# Patient Record
Sex: Male | Born: 1960 | Race: Black or African American | Hispanic: No | Marital: Single | Smoking: Never smoker
Health system: Southern US, Community
[De-identification: ages and names within clinical notes are randomized; demographics above are authoritative.]

## PROBLEM LIST (undated history)

## (undated) ENCOUNTER — Emergency Department (HOSPITAL_COMMUNITY): Disposition: A | Discharge status: Home / Self Care | Payer: Self-pay

## (undated) DIAGNOSIS — Z87442 Personal history of urinary calculi: Secondary | ICD-10-CM

## (undated) DIAGNOSIS — I10 Essential (primary) hypertension: Secondary | ICD-10-CM

## (undated) DIAGNOSIS — K219 Gastro-esophageal reflux disease without esophagitis: Secondary | ICD-10-CM

## (undated) DIAGNOSIS — E7800 Pure hypercholesterolemia, unspecified: Secondary | ICD-10-CM

## (undated) DIAGNOSIS — S63641A Sprain of metacarpophalangeal joint of right thumb, initial encounter: Secondary | ICD-10-CM

## (undated) DIAGNOSIS — M1990 Unspecified osteoarthritis, unspecified site: Secondary | ICD-10-CM

## (undated) DIAGNOSIS — E119 Type 2 diabetes mellitus without complications: Secondary | ICD-10-CM

## (undated) DIAGNOSIS — R7303 Prediabetes: Secondary | ICD-10-CM

## (undated) HISTORY — PX: WISDOM TOOTH EXTRACTION: SHX21

---

## 1999-01-16 ENCOUNTER — Encounter: Payer: Self-pay | Admitting: Emergency Medicine

## 1999-01-16 ENCOUNTER — Emergency Department (HOSPITAL_COMMUNITY): Admission: EM | Admit: 1999-01-16 | Discharge: 1999-01-16 | Payer: Self-pay | Admitting: Emergency Medicine

## 1999-02-17 ENCOUNTER — Emergency Department (HOSPITAL_COMMUNITY): Admission: EM | Admit: 1999-02-17 | Discharge: 1999-02-18 | Payer: Self-pay | Admitting: Emergency Medicine

## 2002-08-03 ENCOUNTER — Emergency Department (HOSPITAL_COMMUNITY): Admission: EM | Admit: 2002-08-03 | Discharge: 2002-08-03 | Payer: Self-pay | Admitting: Emergency Medicine

## 2002-12-06 ENCOUNTER — Emergency Department (HOSPITAL_COMMUNITY): Admission: EM | Admit: 2002-12-06 | Discharge: 2002-12-06 | Payer: Self-pay

## 2002-12-09 ENCOUNTER — Encounter: Payer: Self-pay | Admitting: Emergency Medicine

## 2002-12-09 ENCOUNTER — Emergency Department (HOSPITAL_COMMUNITY): Admission: EM | Admit: 2002-12-09 | Discharge: 2002-12-09 | Payer: Self-pay | Admitting: Emergency Medicine

## 2004-10-11 ENCOUNTER — Emergency Department (HOSPITAL_COMMUNITY): Admission: EM | Admit: 2004-10-11 | Discharge: 2004-10-12 | Payer: Self-pay | Admitting: Emergency Medicine

## 2004-10-16 ENCOUNTER — Emergency Department (HOSPITAL_COMMUNITY): Admission: EM | Admit: 2004-10-16 | Discharge: 2004-10-16 | Payer: Self-pay | Admitting: Family Medicine

## 2005-06-29 ENCOUNTER — Emergency Department (HOSPITAL_COMMUNITY): Admission: EM | Admit: 2005-06-29 | Discharge: 2005-06-30 | Payer: Self-pay | Admitting: Emergency Medicine

## 2005-07-18 ENCOUNTER — Emergency Department (HOSPITAL_COMMUNITY): Admission: EM | Admit: 2005-07-18 | Discharge: 2005-07-18 | Payer: Self-pay | Admitting: Family Medicine

## 2005-09-05 ENCOUNTER — Emergency Department (HOSPITAL_COMMUNITY): Admission: EM | Admit: 2005-09-05 | Discharge: 2005-09-05 | Payer: Self-pay | Admitting: Family Medicine

## 2005-12-08 ENCOUNTER — Emergency Department (HOSPITAL_COMMUNITY): Admission: EM | Admit: 2005-12-08 | Discharge: 2005-12-08 | Payer: Self-pay | Admitting: Family Medicine

## 2006-06-08 ENCOUNTER — Emergency Department (HOSPITAL_COMMUNITY): Admission: EM | Admit: 2006-06-08 | Discharge: 2006-06-08 | Payer: Self-pay | Admitting: Family Medicine

## 2006-06-26 ENCOUNTER — Emergency Department (HOSPITAL_COMMUNITY): Admission: EM | Admit: 2006-06-26 | Discharge: 2006-06-26 | Payer: Self-pay | Admitting: Emergency Medicine

## 2006-11-22 ENCOUNTER — Emergency Department (HOSPITAL_COMMUNITY): Admission: EM | Admit: 2006-11-22 | Discharge: 2006-11-22 | Payer: Self-pay | Admitting: Emergency Medicine

## 2007-02-28 ENCOUNTER — Emergency Department (HOSPITAL_COMMUNITY): Admission: EM | Admit: 2007-02-28 | Discharge: 2007-02-28 | Payer: Self-pay | Admitting: Emergency Medicine

## 2007-03-06 ENCOUNTER — Emergency Department (HOSPITAL_COMMUNITY): Admission: EM | Admit: 2007-03-06 | Discharge: 2007-03-07 | Payer: Self-pay | Admitting: Emergency Medicine

## 2007-12-23 ENCOUNTER — Emergency Department (HOSPITAL_COMMUNITY): Admission: EM | Admit: 2007-12-23 | Discharge: 2007-12-23 | Payer: Self-pay | Admitting: Emergency Medicine

## 2008-01-29 ENCOUNTER — Encounter: Admission: RE | Admit: 2008-01-29 | Discharge: 2008-01-29 | Payer: Self-pay | Admitting: Gastroenterology

## 2008-03-25 ENCOUNTER — Emergency Department (HOSPITAL_COMMUNITY): Admission: EM | Admit: 2008-03-25 | Discharge: 2008-03-25 | Payer: Self-pay | Admitting: Family Medicine

## 2009-06-08 ENCOUNTER — Emergency Department (HOSPITAL_BASED_OUTPATIENT_CLINIC_OR_DEPARTMENT_OTHER): Admission: EM | Admit: 2009-06-08 | Discharge: 2009-06-08 | Payer: Self-pay | Admitting: Emergency Medicine

## 2010-03-12 ENCOUNTER — Emergency Department (HOSPITAL_BASED_OUTPATIENT_CLINIC_OR_DEPARTMENT_OTHER): Admission: EM | Admit: 2010-03-12 | Discharge: 2010-03-12 | Payer: Self-pay | Admitting: Emergency Medicine

## 2010-03-12 ENCOUNTER — Ambulatory Visit: Payer: Self-pay | Admitting: Diagnostic Radiology

## 2010-04-29 ENCOUNTER — Emergency Department (HOSPITAL_COMMUNITY): Admission: EM | Admit: 2010-04-29 | Discharge: 2010-04-29 | Payer: Self-pay | Admitting: Emergency Medicine

## 2010-11-23 LAB — URINALYSIS, ROUTINE W REFLEX MICROSCOPIC
Protein, ur: NEGATIVE mg/dL
Urobilinogen, UA: 0.2 mg/dL (ref 0.0–1.0)

## 2010-11-26 LAB — COMPREHENSIVE METABOLIC PANEL
ALT: 31 U/L (ref 0–53)
AST: 27 U/L (ref 0–37)
Alkaline Phosphatase: 85 U/L (ref 39–117)
CO2: 26 mEq/L (ref 19–32)
Chloride: 104 mEq/L (ref 96–112)
Creatinine, Ser: 1.2 mg/dL (ref 0.4–1.5)
GFR calc Af Amer: >60 mL/min (ref >60)
GFR calc non Af Amer: >60 mL/min (ref >60)
Potassium: 3.9 mEq/L (ref 3.5–5.1)
Sodium: 143 mEq/L (ref 135–145)
Total Bilirubin: 0.6 mg/dL (ref 0.3–1.2)

## 2010-11-26 LAB — COMPREHENSIVE METABOLIC PANEL WITH GFR
Albumin: 4 g/dL (ref 3.5–5.2)
BUN: 14 mg/dL (ref 6–23)
Calcium: 8.6 mg/dL (ref 8.4–10.5)
Glucose, Bld: 146 mg/dL — ABNORMAL HIGH (ref 70–99)
Total Protein: 7.9 g/dL (ref 6.0–8.3)

## 2010-11-26 LAB — CBC
HCT: 40.8 % (ref 39.0–52.0)
Hemoglobin: 13.5 g/dL (ref 13.0–17.0)
MCH: 28.9 pg (ref 26.0–34.0)
MCHC: 33.2 g/dL (ref 30.0–36.0)
MCV: 87.1 fL (ref 78.0–100.0)
Platelets: 275 K/uL (ref 150–400)
RBC: 4.68 MIL/uL (ref 4.22–5.81)
RDW: 12.1 % (ref 11.5–15.5)
WBC: 10.7 K/uL — ABNORMAL HIGH (ref 4.0–10.5)

## 2010-11-26 LAB — URINALYSIS, ROUTINE W REFLEX MICROSCOPIC
Bilirubin Urine: NEGATIVE
Glucose, UA: NEGATIVE mg/dL
Ketones, ur: NEGATIVE mg/dL
Leukocytes, UA: NEGATIVE
Nitrite: NEGATIVE
Protein, ur: NEGATIVE mg/dL
Specific Gravity, Urine: 1.023 (ref 1.005–1.030)
Urobilinogen, UA: 1 mg/dL (ref 0.0–1.0)
pH: 5 (ref 5.0–8.0)

## 2010-11-26 LAB — URINE MICROSCOPIC-ADD ON

## 2010-11-26 LAB — DIFFERENTIAL
Basophils Absolute: 0 10*3/uL (ref 0.0–0.1)
Basophils Relative: 0 % (ref 0–1)
Eosinophils Absolute: 0.2 10*3/uL (ref 0.0–0.7)
Eosinophils Relative: 2 % (ref 0–5)
Lymphocytes Relative: 27 % (ref 12–46)
Lymphs Abs: 2.8 K/uL (ref 0.7–4.0)
Monocytes Absolute: 0.6 K/uL (ref 0.1–1.0)
Monocytes Relative: 6 % (ref 3–12)
Neutro Abs: 7.1 K/uL (ref 1.7–7.7)
Neutrophils Relative %: 65 % (ref 43–77)

## 2010-11-26 LAB — LIPASE, BLOOD: Lipase: 55 U/L (ref 23–300)

## 2011-06-05 LAB — POCT CARDIAC MARKERS: Myoglobin, poc: 228

## 2011-06-05 LAB — COMPREHENSIVE METABOLIC PANEL
ALT: 51
CO2: 27
Calcium: 9.1
Chloride: 98
Creatinine, Ser: 1.16
GFR calc non Af Amer: >60
Glucose, Bld: 97
Sodium: 134 — ABNORMAL LOW
Total Bilirubin: 1.3 — ABNORMAL HIGH

## 2011-06-05 LAB — DIFFERENTIAL
Basophils Absolute: 0
Basophils Relative: 0
Eosinophils Absolute: 0
Eosinophils Relative: 0
Lymphs Abs: 1.6
Neutrophils Relative %: 86 — ABNORMAL HIGH

## 2011-06-05 LAB — CBC
Hemoglobin: 14.8
MCHC: 34.9
MCV: 86
RBC: 4.95
WBC: 14.9 — ABNORMAL HIGH

## 2011-06-05 LAB — CK TOTAL AND CKMB (NOT AT ARMC)
CK, MB: 2.5
Total CK: 683 — ABNORMAL HIGH

## 2011-06-05 LAB — URINALYSIS, ROUTINE W REFLEX MICROSCOPIC
Bilirubin Urine: NEGATIVE
Ketones, ur: NEGATIVE
Nitrite: NEGATIVE
Urobilinogen, UA: 1

## 2011-06-27 LAB — COMPREHENSIVE METABOLIC PANEL
ALT: 24
AST: 17
Albumin: 3.9
Alkaline Phosphatase: 62
BUN: 12
CO2: 30
Calcium: 9.1
Chloride: 105
Creatinine, Ser: 1.24
GFR calc Af Amer: >60
GFR calc non Af Amer: >60
Glucose, Bld: 104 — ABNORMAL HIGH
Potassium: 4.1
Sodium: 140
Total Bilirubin: 0.9
Total Protein: 7.2

## 2011-07-11 ENCOUNTER — Emergency Department (HOSPITAL_COMMUNITY): Payer: Self-pay

## 2011-07-11 ENCOUNTER — Emergency Department (HOSPITAL_COMMUNITY)
Admission: EM | Admit: 2011-07-11 | Discharge: 2011-07-11 | Disposition: A | Discharge status: Home / Self Care | Payer: Self-pay | Attending: Emergency Medicine | Admitting: Emergency Medicine

## 2011-07-11 DIAGNOSIS — R05 Cough: Secondary | ICD-10-CM | POA: Insufficient documentation

## 2011-07-11 DIAGNOSIS — I10 Essential (primary) hypertension: Secondary | ICD-10-CM | POA: Insufficient documentation

## 2011-07-11 DIAGNOSIS — J40 Bronchitis, not specified as acute or chronic: Secondary | ICD-10-CM | POA: Insufficient documentation

## 2011-07-11 DIAGNOSIS — R059 Cough, unspecified: Secondary | ICD-10-CM | POA: Insufficient documentation

## 2011-07-11 DIAGNOSIS — R079 Chest pain, unspecified: Secondary | ICD-10-CM | POA: Insufficient documentation

## 2012-01-26 ENCOUNTER — Emergency Department (HOSPITAL_COMMUNITY)
Admission: EM | Admit: 2012-01-26 | Discharge: 2012-01-27 | Disposition: A | Discharge status: Home / Self Care | Payer: Self-pay | Attending: Emergency Medicine | Admitting: Emergency Medicine

## 2012-01-26 ENCOUNTER — Encounter (HOSPITAL_COMMUNITY): Payer: Self-pay | Admitting: *Deleted

## 2012-01-26 DIAGNOSIS — I2510 Atherosclerotic heart disease of native coronary artery without angina pectoris: Secondary | ICD-10-CM | POA: Insufficient documentation

## 2012-01-26 DIAGNOSIS — M542 Cervicalgia: Secondary | ICD-10-CM | POA: Insufficient documentation

## 2012-01-26 DIAGNOSIS — R079 Chest pain, unspecified: Secondary | ICD-10-CM | POA: Insufficient documentation

## 2012-01-26 DIAGNOSIS — I440 Atrioventricular block, first degree: Secondary | ICD-10-CM | POA: Insufficient documentation

## 2012-01-26 DIAGNOSIS — R51 Headache: Secondary | ICD-10-CM | POA: Insufficient documentation

## 2012-01-26 DIAGNOSIS — R5383 Other fatigue: Secondary | ICD-10-CM | POA: Insufficient documentation

## 2012-01-26 DIAGNOSIS — R5381 Other malaise: Secondary | ICD-10-CM | POA: Insufficient documentation

## 2012-01-26 DIAGNOSIS — E7800 Pure hypercholesterolemia, unspecified: Secondary | ICD-10-CM | POA: Insufficient documentation

## 2012-01-26 DIAGNOSIS — I10 Essential (primary) hypertension: Secondary | ICD-10-CM | POA: Insufficient documentation

## 2012-01-26 HISTORY — DX: Pure hypercholesterolemia, unspecified: E78.00

## 2012-01-26 HISTORY — DX: Essential (primary) hypertension: I10

## 2012-01-26 LAB — CBC
HCT: 40.1 % (ref 39.0–52.0)
Hemoglobin: 13.6 g/dL (ref 13.0–17.0)
MCHC: 33.9 g/dL (ref 30.0–36.0)
RBC: 4.64 MIL/uL (ref 4.22–5.81)
WBC: 11.2 10*3/uL — ABNORMAL HIGH (ref 4.0–10.5)

## 2012-01-26 NOTE — ED Notes (Signed)
Pt c/o sensation of chest fullness and pressure woke with this morning. Pt c/o headache, blurred vision and swollen extremities. Pt denies unilateral weakness, drove self tonight and ambulatory on arrival. Pt in no acute distress at this time.

## 2012-01-26 NOTE — ED Notes (Signed)
Pt states he was awoken this morning with a feeling a chest fullness, neck pain, and "pressure" throughout body. Pt states he usually has similar symptoms when he BP is high. Pt states he is currently out of his BP meds but he did take a "sample pill" today for tx. Pt states the feeling of pressure and pain has subsided but does still c/o slight headache. Pt also states his extremities were swelling but this has also subsided. Pt states he is here "to be checked out" and "make sure it's not something serious".

## 2012-01-27 ENCOUNTER — Encounter (HOSPITAL_COMMUNITY): Payer: Self-pay | Admitting: Emergency Medicine

## 2012-01-27 LAB — TROPONIN I: Troponin I: <0.3 ng/mL (ref <0.30)

## 2012-01-27 LAB — BASIC METABOLIC PANEL
BUN: 12 mg/dL (ref 6–23)
CO2: 29 mEq/L (ref 19–32)
Chloride: 100 mEq/L (ref 96–112)
Glucose, Bld: 86 mg/dL (ref 70–99)
Potassium: 4 mEq/L (ref 3.5–5.1)
Sodium: 138 mEq/L (ref 135–145)

## 2012-01-27 NOTE — Discharge Instructions (Signed)

## 2012-01-27 NOTE — ED Provider Notes (Addendum)
History     CSN: [id number]  Arrival date & time 01/26/12  2301   First MD Initiated Contact with Patient 01/27/12 0008      Chief Complaint  Patient presents with  . Blurred Vision  . Headache  . Chest Pain    pressure    (Consider location/radiation/quality/duration/timing/severity/associated sxs/prior treatment) HPI Comments: Patient presents tonight for concerns that his blood pressure was elevated.  Patient notes that his primary care physician has been adjusting his blood pressure medications at home to obtain better control.  Will patient notes that she intermittently gets symptoms at night like tonight.  He feels a throbbing in his neck and his posterior head and believes is related to his blood pressure.  He describes a feeling of swelling in his abdomen and in his hands.  No leg swelling.  No specific chest pain or shortness of breath but feels that his potential abdominal swelling is causing pressure.  Patient also describes some vague generalized weakness particularly with chewing.  He states that after a few bites he has to stop to work to swallow more often and believes that this is just started since the new blood pressure medication.  Patient is a 51 y.o. male presenting with headaches and chest pain. The history is provided by the patient. No language interpreter was used.  Headache  This is a recurrent problem. The current episode started 1 to 2 hours ago. Pertinent negatives include no fever, no shortness of breath, no nausea and no vomiting.  Chest Pain Pertinent negatives for primary symptoms include no fever, no shortness of breath, no cough, no abdominal pain, no nausea and no vomiting.  Associated symptoms include weakness.     Past Medical History  Diagnosis Date  . Hypertension   . Hypercholesterolemia   . Coronary artery disease     History reviewed. No pertinent past surgical history.  History reviewed. No pertinent family history.  History    Substance Use Topics  . Smoking status: Never Smoker   . Smokeless tobacco: Not on file  . Alcohol Use: No      Review of Systems  Constitutional: Negative for fever and chills.  Eyes: Negative.  Negative for discharge and redness.  Respiratory: Negative.  Negative for cough and shortness of breath.   Cardiovascular: Negative for chest pain.  Gastrointestinal: Negative.  Negative for nausea, vomiting and abdominal pain.  Genitourinary: Negative.  Negative for hematuria.  Musculoskeletal: Negative for back pain.  Skin: Negative.  Negative for color change and rash.  Neurological: Positive for weakness and headaches. Negative for syncope.  Hematological: Negative.  Negative for adenopathy.  Psychiatric/Behavioral: Negative.  Negative for confusion.  All other systems reviewed and are negative.    Allergies  Review of patient's allergies indicates no known allergies.  Home Medications   Current Outpatient Rx  Name Route Sig Dispense Refill  . AMLODIPINE-VALSARTAN-HCTZ 5-160-12.5 MG PO TABS Oral Take 1 tablet by mouth daily. Sample Medication given from Dr. Chestine Spore      BP 142/87  Pulse 52  Temp 97.9 F (36.6 C)  Wt 220 lb 6 oz (99.961 kg)  SpO2 99%  Physical Exam  Nursing note and vitals reviewed. Constitutional: He is oriented to person, place, and time. He appears well-developed and well-nourished.  Non-toxic appearance. He does not have a sickly appearance.  HENT:  Head: Normocephalic and atraumatic.  Eyes: Conjunctivae, EOM and lids are normal. Pupils are equal, round, and reactive to light.  Neck: Trachea normal,  normal range of motion and full passive range of motion without pain. Neck supple.  Cardiovascular: Normal rate, regular rhythm and normal heart sounds.   Pulmonary/Chest: Effort normal and breath sounds normal. No respiratory distress.  Abdominal: Soft. Normal appearance. He exhibits no distension. There is no tenderness. There is no rebound and no CVA  tenderness.  Musculoskeletal: Normal range of motion.  Neurological: He is alert and oriented to person, place, and time. He has normal strength.  Skin: Skin is warm, dry and intact. No rash noted.  Psychiatric: He has a normal mood and affect. His behavior is normal. Judgment and thought content normal.    ED Course  Procedures (including critical care time)  Results for orders placed during the hospital encounter of 01/26/12  CBC      Component Value Range   WBC 11.2 (*) 4.0 - 10.5 (K/uL)   RBC 4.64  4.22 - 5.81 (MIL/uL)   Hemoglobin 13.6  13.0 - 17.0 (g/dL)   HCT 40.9  81.1 - 91.4 (%)   MCV 86.4  78.0 - 100.0 (fL)   MCH 29.3  26.0 - 34.0 (pg)   MCHC 33.9  30.0 - 36.0 (g/dL)   RDW 78.2  95.6 - 21.3 (%)   Platelets 294  150 - 400 (K/uL)  BASIC METABOLIC PANEL      Component Value Range   Sodium 138  135 - 145 (mEq/L)   Potassium 4.0  3.5 - 5.1 (mEq/L)   Chloride 100  96 - 112 (mEq/L)   CO2 29  19 - 32 (mEq/L)   Glucose, Bld 86  70 - 99 (mg/dL)   BUN 12  6 - 23 (mg/dL)   Creatinine, Ser 0.86  0.50 - 1.35 (mg/dL)   Calcium 9.1  8.4 - 57.8 (mg/dL)   GFR calc non Af Amer 82 (*) >90 (mL/min)   GFR calc Af Amer >90  >90 (mL/min)  PRO B NATRIURETIC PEPTIDE      Component Value Range   Pro B Natriuretic peptide (BNP) 103.5  0 - 125 (pg/mL)  TROPONIN I      Component Value Range   Troponin I <0.30  <0.30 (ng/mL)    Date: 01/27/2012  Rate: 53  Rhythm: normal sinus rhythm  QRS Axis: normal  Intervals: Prolonged PR  ST/T Wave abnormalities: normal  Conduction Disutrbances:first-degree A-V block   Narrative Interpretation:   Old EKG Reviewed: unchanged from 07/11/11     MDM  Patient with concerns primarily about his blood pressure which does not show significant elevation here.  He notes some mild neck throbbing which he refuses even Tylenol or ibuprofen for her.  Patient does not specifically describe chest pain or pressure to me at this time nor any specific shortness of  breath to indicate that he is signs of ACS.  I am going to check electrolytes to evaluate for the patient's feeling of generalized weakness which potentially be related to his diuretic.  I've also advised him that he needs to followup with Dr. Chestine Spore in the next one to 2 weeks as he may potentially be having adverse affects from his blood pressure medication and may need adjustments on this.        Nat Christen, MD 01/27/12 [id number]  Nat Christen, MD 01/27/12 [phone]

## 2012-11-05 ENCOUNTER — Emergency Department (HOSPITAL_COMMUNITY)
Admission: EM | Admit: 2012-11-05 | Discharge: 2012-11-05 | Disposition: A | Discharge status: Home / Self Care | Payer: Self-pay | Attending: Emergency Medicine | Admitting: Emergency Medicine

## 2012-11-05 ENCOUNTER — Emergency Department (HOSPITAL_COMMUNITY): Payer: Self-pay

## 2012-11-05 ENCOUNTER — Encounter (HOSPITAL_COMMUNITY): Payer: Self-pay | Admitting: *Deleted

## 2012-11-05 DIAGNOSIS — Z862 Personal history of diseases of the blood and blood-forming organs and certain disorders involving the immune mechanism: Secondary | ICD-10-CM | POA: Insufficient documentation

## 2012-11-05 DIAGNOSIS — X58XXXA Exposure to other specified factors, initial encounter: Secondary | ICD-10-CM | POA: Insufficient documentation

## 2012-11-05 DIAGNOSIS — I10 Essential (primary) hypertension: Secondary | ICD-10-CM | POA: Insufficient documentation

## 2012-11-05 DIAGNOSIS — I2510 Atherosclerotic heart disease of native coronary artery without angina pectoris: Secondary | ICD-10-CM | POA: Insufficient documentation

## 2012-11-05 DIAGNOSIS — Y939 Activity, unspecified: Secondary | ICD-10-CM | POA: Insufficient documentation

## 2012-11-05 DIAGNOSIS — Z8639 Personal history of other endocrine, nutritional and metabolic disease: Secondary | ICD-10-CM | POA: Insufficient documentation

## 2012-11-05 DIAGNOSIS — S93409A Sprain of unspecified ligament of unspecified ankle, initial encounter: Secondary | ICD-10-CM | POA: Insufficient documentation

## 2012-11-05 DIAGNOSIS — Y929 Unspecified place or not applicable: Secondary | ICD-10-CM | POA: Insufficient documentation

## 2012-11-05 DIAGNOSIS — S93402A Sprain of unspecified ligament of left ankle, initial encounter: Secondary | ICD-10-CM

## 2012-11-05 MED ORDER — IBUPROFEN 600 MG PO TABS: 600.0000 mg | ORAL_TABLET | Freq: Four times a day (QID) | ORAL | Status: DC | PRN

## 2012-11-05 NOTE — ED Provider Notes (Signed)
History     CSN: [id number]  Arrival date & time 11/05/12  0105   First MD Initiated Contact with Patient 11/05/12 0120      Chief Complaint  Patient presents with  . Ankle Injury    (Consider location/radiation/quality/duration/timing/severity/associated sxs/prior treatment) HPI History per PT. Twisted left ankle with inversion injury today prior to arrival.  Has pain and swelling over the lateral malleolus. Hurts to bear weight. Mild/moderate severity. No weakness or numbness. No other pain trauma or injury. Declines any pain medications at this time. No fall. No neck pain. No LOC. Past Medical History  Diagnosis Date  . Hypertension   . Hypercholesterolemia   . Coronary artery disease     History reviewed. No pertinent past surgical history.  History reviewed. No pertinent family history.  History  Substance Use Topics  . Smoking status: Never Smoker   . Smokeless tobacco: Not on file  . Alcohol Use: No      Review of Systems  Constitutional: Negative for fever and diaphoresis.  HENT: Negative for neck pain.   Eyes: Negative for visual disturbance.  Respiratory: Negative for shortness of breath.   Cardiovascular: Negative for chest pain.  Gastrointestinal: Negative for abdominal pain.  Genitourinary: Negative for flank pain.  Musculoskeletal: Negative for back pain.  Skin: Negative for rash.  Neurological: Negative for syncope and headaches.  All other systems reviewed and are negative.    Allergies  Review of patient's allergies indicates no known allergies.  Home Medications  No current outpatient prescriptions on file.  BP 163/87  Pulse 87  Temp(Src) 98.7 F (37.1 C) (Oral)  Resp 18  SpO2 100%  Physical Exam  Constitutional: He is oriented to person, place, and time. He appears well-developed and well-nourished.  HENT:  Head: Normocephalic and atraumatic.  Eyes: EOM are normal. Pupils are equal, round, and reactive to light.  Neck: Neck  supple.  Cardiovascular: Regular rhythm and intact distal pulses.   Pulmonary/Chest: Effort normal. No respiratory distress.  Musculoskeletal:  Left lower extremity with swelling and tenderness over the lateral malleolus. Skin intact throughout. No ecchymosis. Distal neurovascular intact. No tenderness over the proximal fibula or foot.   Neurological: He is alert and oriented to person, place, and time.  Skin: Skin is warm and dry.    ED Course  Procedures (including critical care time)  Labs Reviewed - No data to display Dg Ankle Complete Left  11/05/2012  *RADIOLOGY REPORT*  Clinical Data: Left ankle injury and pain.  LEFT ANKLE COMPLETE - 3+ VIEW  Comparison: 03/25/2008  Findings: There is no evidence of acute fracture, subluxation or dislocation. The ankle mortise is intact. The talar dome is unremarkable. No focal bony lesions are present. Lateral soft tissue swelling is noted.  IMPRESSION: Soft tissue swelling without acute bony abnormality.   Original Report Authenticated By: Harmon Pier, M.D.    Ice. Splint. Crutches.   Ankle sprain precautions provided and verbalizes understood.    Occult fracture precautions provided.   MDM  Left ankle sprain  Distal neurovascular intact after application of splint  Vital signs and nursing notes reviewed and considered.        Sunnie Nielsen, MD 11/05/12 Earle Gell

## 2012-12-01 ENCOUNTER — Encounter (HOSPITAL_BASED_OUTPATIENT_CLINIC_OR_DEPARTMENT_OTHER): Payer: Self-pay

## 2012-12-01 ENCOUNTER — Emergency Department (HOSPITAL_BASED_OUTPATIENT_CLINIC_OR_DEPARTMENT_OTHER)
Admission: EM | Admit: 2012-12-01 | Discharge: 2012-12-01 | Disposition: A | Discharge status: Home / Self Care | Payer: Self-pay | Attending: Emergency Medicine | Admitting: Emergency Medicine

## 2012-12-01 DIAGNOSIS — R197 Diarrhea, unspecified: Secondary | ICD-10-CM | POA: Insufficient documentation

## 2012-12-01 DIAGNOSIS — I10 Essential (primary) hypertension: Secondary | ICD-10-CM | POA: Insufficient documentation

## 2012-12-01 DIAGNOSIS — R109 Unspecified abdominal pain: Secondary | ICD-10-CM | POA: Insufficient documentation

## 2012-12-01 DIAGNOSIS — I2510 Atherosclerotic heart disease of native coronary artery without angina pectoris: Secondary | ICD-10-CM | POA: Insufficient documentation

## 2012-12-01 DIAGNOSIS — R42 Dizziness and giddiness: Secondary | ICD-10-CM | POA: Insufficient documentation

## 2012-12-01 DIAGNOSIS — IMO0001 Reserved for inherently not codable concepts without codable children: Secondary | ICD-10-CM | POA: Insufficient documentation

## 2012-12-01 DIAGNOSIS — E7800 Pure hypercholesterolemia, unspecified: Secondary | ICD-10-CM | POA: Insufficient documentation

## 2012-12-01 DIAGNOSIS — R112 Nausea with vomiting, unspecified: Secondary | ICD-10-CM | POA: Insufficient documentation

## 2012-12-01 LAB — URINALYSIS, ROUTINE W REFLEX MICROSCOPIC
Glucose, UA: NEGATIVE mg/dL
Hgb urine dipstick: NEGATIVE
Ketones, ur: NEGATIVE mg/dL
Leukocytes, UA: NEGATIVE
Protein, ur: NEGATIVE mg/dL
Urobilinogen, UA: 1 mg/dL (ref 0.0–1.0)

## 2012-12-01 LAB — COMPREHENSIVE METABOLIC PANEL
Albumin: 3.9 g/dL (ref 3.5–5.2)
Alkaline Phosphatase: 75 U/L (ref 39–117)
BUN: 25 mg/dL — ABNORMAL HIGH (ref 6–23)
CO2: 25 mEq/L (ref 19–32)
Chloride: 98 mEq/L (ref 96–112)
Creatinine, Ser: 1.3 mg/dL (ref 0.50–1.35)
GFR calc non Af Amer: 63 mL/min — ABNORMAL LOW (ref >90)
Potassium: 3.8 mEq/L (ref 3.5–5.1)
Total Bilirubin: 0.4 mg/dL (ref 0.3–1.2)

## 2012-12-01 LAB — CBC
HCT: 43.4 % (ref 39.0–52.0)
Hemoglobin: 14.8 g/dL (ref 13.0–17.0)
MCV: 86.5 fL (ref 78.0–100.0)
RBC: 5.02 MIL/uL (ref 4.22–5.81)
RDW: 13 % (ref 11.5–15.5)
WBC: 7.1 10*3/uL (ref 4.0–10.5)

## 2012-12-01 MED ORDER — SODIUM CHLORIDE 0.9 % IV BOLUS (SEPSIS)
1000.0000 mL | Freq: Once | INTRAVENOUS | Status: AC
  Administered 2012-12-01: 1000 mL via INTRAVENOUS

## 2012-12-01 NOTE — ED Notes (Signed)
Pt states that he feels like he has a stomach flu, abdominal cramping, feverish, chills, body aches, nausea, vomited x2 in past 12 hours.

## 2012-12-01 NOTE — ED Notes (Signed)
Pt given 240cc ginger ale for fluid challenge.

## 2012-12-01 NOTE — ED Provider Notes (Signed)
History  This chart was scribed for Ethelda Chick, MD by Shari Heritage, ED Scribe. The patient was seen in room MH04/MH04. Patient's care was started at 1509.   CSN: [id number]  Arrival date & time 12/01/12  1341   First MD Initiated Contact with Patient 12/01/12 [phone]      Chief Complaint  Patient presents with  . Diarrhea     Patient is a 52 y.o. male presenting with diarrhea. The history is provided by the patient. No language interpreter was used.  Diarrhea Quality:  Watery Severity:  Moderate Onset quality:  Gradual Duration:  4 days Timing:  Intermittent Progression:  Improving Relieved by:  Anti-motility medications Associated symptoms: abdominal pain, myalgias and vomiting   Associated symptoms: no fever      HPI Comments: Donald Cole is a 52 y.o. male who presents to the Emergency Department complaining of gradually improving intermittent diarrhea, sporadic generalized abdominal cramping and lightheadedness. Patient states that watery diarrhea began 4 days ago. He says that he took Immodium last night that made his stool firmer for one bowel movement, but then diarrhea persisted. He reports one episode of loose stool today. He says that he has also had persistent feelings of lightheadedness today which prompted him to come to the ED for treatment. There is associated chills, body aches, nausea and vomiting. Patient states that he has had 2 episodes of vomiting in the past 12 hours. Patient denies blood in stool, hematemesis, fever, shortness of breath or chest pain. He has a medical history of hypertension, hypercholesterolemia and coronary artery disease. Patient does not smoke.  Past Medical History  Diagnosis Date  . Hypertension   . Hypercholesterolemia   . Coronary artery disease     History reviewed. No pertinent past surgical history.  History reviewed. No pertinent family history.  History  Substance Use Topics  . Smoking status: Never Smoker   .  Smokeless tobacco: Never Used  . Alcohol Use: No      Review of Systems  Constitutional: Negative for fever.  Respiratory: Negative for shortness of breath.   Cardiovascular: Negative for chest pain.  Gastrointestinal: Positive for nausea, vomiting, abdominal pain and diarrhea. Negative for blood in stool.  Musculoskeletal: Positive for myalgias.  Neurological: Positive for light-headedness.  All other systems reviewed and are negative.    Allergies  Review of patient's allergies indicates no known allergies.  Home Medications   Current Outpatient Rx  Name  Route  Sig  Dispense  Refill  . loperamide (IMODIUM A-D) 2 MG tablet   Oral   Take 2 mg by mouth 4 (four) times daily as needed for diarrhea or loose stools.         Marland Kitchen ibuprofen (ADVIL,MOTRIN) 600 MG tablet   Oral   Take 1 tablet (600 mg total) by mouth every 6 (six) hours as needed for pain.   15 tablet   0     Triage Vitals: BP 105/61  Pulse 63  Temp(Src) 98.1 F (36.7 C) (Oral)  Resp 16  Ht 5\' 8"  (1.727 m)  Wt 215 lb (97.523 kg)  BMI 32.7 kg/m2  SpO2 99%  Physical Exam  Constitutional: He is oriented to person, place, and time. He appears well-developed and well-nourished.  HENT:  Mouth/Throat: Oropharynx is clear and moist and mucous membranes are normal. Mucous membranes are not dry.  Eyes: Conjunctivae are normal. Pupils are equal, round, and reactive to light. No scleral icterus.  Neck: Normal range of motion.  Neck supple.  Cardiovascular: Normal rate, regular rhythm and normal heart sounds.   No murmur heard. Pulmonary/Chest: Effort normal and breath sounds normal. No respiratory distress.  Abdominal: Soft. Bowel sounds are normal. He exhibits no distension and no mass. There is no tenderness. There is no rebound and no guarding.  Musculoskeletal: Normal range of motion.  Neurological: He is alert and oriented to person, place, and time.  Skin: Skin is warm and dry.  Cap refill less than 3  seconds bilaterally.  Psychiatric: He has a normal mood and affect. His behavior is normal.    ED Course  Procedures (including critical care time) DIAGNOSTIC STUDIES: Oxygen Saturation is 99% on room air, normal by my interpretation.    COORDINATION OF CARE: 3:19 PM- Patient informed of current plan for treatment and evaluation and agrees with plan at this time.    Date: 12/01/2012  Rate: 53  Rhythm: sinus bradycardia with first degree AV block  QRS Axis: normal  Intervals: PR prolonged  ST/T Wave abnormalities: nonspecific T wave changes  Conduction Disutrbances:first-degree A-V block   Narrative Interpretation:   Old EKG Reviewed: no significant changes compared to prior ekg of 01/26/12      Labs Reviewed  COMPREHENSIVE METABOLIC PANEL - Abnormal; Notable for the following:    BUN 25 (*)    GFR calc non Af Amer 63 (*)    GFR calc Af Amer 73 (*)    All other components within normal limits  CBC  URINALYSIS, ROUTINE W REFLEX MICROSCOPIC   No results found.   1. Diarrhea       MDM  Pt presenting with c/o diarrhea over the past 4 days, also feels lightheaded intermittently.  Labs reassuring, given IV hydration and BP improved.  EKG without acute abnormalities or changes.  Suspect viral process.  Discharged with strict return precautions.  Pt agreeable with plan.   I personally performed the services described in this documentation, which was scribed in my presence. The recorded information has been reviewed and is accurate.    Ethelda Chick, MD 12/01/12 1750

## 2013-04-04 ENCOUNTER — Encounter (HOSPITAL_COMMUNITY): Payer: Self-pay | Admitting: *Deleted

## 2013-04-04 ENCOUNTER — Emergency Department (HOSPITAL_COMMUNITY)
Admission: EM | Admit: 2013-04-04 | Discharge: 2013-04-04 | Disposition: A | Discharge status: Home / Self Care | Payer: BC Managed Care – PPO | Attending: Emergency Medicine | Admitting: Emergency Medicine

## 2013-04-04 DIAGNOSIS — I10 Essential (primary) hypertension: Secondary | ICD-10-CM | POA: Insufficient documentation

## 2013-04-04 DIAGNOSIS — E785 Hyperlipidemia, unspecified: Secondary | ICD-10-CM | POA: Insufficient documentation

## 2013-04-04 DIAGNOSIS — R112 Nausea with vomiting, unspecified: Secondary | ICD-10-CM | POA: Insufficient documentation

## 2013-04-04 DIAGNOSIS — I2510 Atherosclerotic heart disease of native coronary artery without angina pectoris: Secondary | ICD-10-CM | POA: Insufficient documentation

## 2013-04-04 DIAGNOSIS — R197 Diarrhea, unspecified: Secondary | ICD-10-CM | POA: Insufficient documentation

## 2013-04-04 DIAGNOSIS — R55 Syncope and collapse: Secondary | ICD-10-CM | POA: Insufficient documentation

## 2013-04-04 DIAGNOSIS — R404 Transient alteration of awareness: Secondary | ICD-10-CM | POA: Insufficient documentation

## 2013-04-04 LAB — POCT I-STAT, CHEM 8
Creatinine, Ser: 1.9 mg/dL — ABNORMAL HIGH (ref 0.50–1.35)
Hemoglobin: 15.3 g/dL (ref 13.0–17.0)
Sodium: 139 mEq/L (ref 135–145)
TCO2: 24 mmol/L (ref 0–100)

## 2013-04-04 LAB — URINALYSIS, ROUTINE W REFLEX MICROSCOPIC
Glucose, UA: NEGATIVE mg/dL
Leukocytes, UA: NEGATIVE
Protein, ur: 100 mg/dL — AB
pH: 5.5 (ref 5.0–8.0)

## 2013-04-04 LAB — CBC WITH DIFFERENTIAL/PLATELET
Eosinophils Absolute: 0.1 10*3/uL (ref 0.0–0.7)
Lymphocytes Relative: 13 % (ref 12–46)
Lymphs Abs: 1.9 10*3/uL (ref 0.7–4.0)
Neutro Abs: 11.5 10*3/uL — ABNORMAL HIGH (ref 1.7–7.7)
Neutrophils Relative %: 80 % — ABNORMAL HIGH (ref 43–77)
Platelets: 257 10*3/uL (ref 150–400)
RBC: 4.84 MIL/uL (ref 4.22–5.81)
WBC: 14.4 10*3/uL — ABNORMAL HIGH (ref 4.0–10.5)

## 2013-04-04 LAB — POCT I-STAT TROPONIN I

## 2013-04-04 LAB — URINE MICROSCOPIC-ADD ON

## 2013-04-04 LAB — GLUCOSE, CAPILLARY: Glucose-Capillary: 148 mg/dL — ABNORMAL HIGH (ref 70–99)

## 2013-04-04 MED ORDER — SODIUM CHLORIDE 0.9 % IV SOLN
1000.0000 mL | Freq: Once | INTRAVENOUS | Status: AC
  Administered 2013-04-04: 1000 mL via INTRAVENOUS

## 2013-04-04 MED ORDER — ONDANSETRON 4 MG PO TBDP
8.0000 mg | ORAL_TABLET | Freq: Once | ORAL | Status: AC
  Administered 2013-04-04: 8 mg via ORAL
  Filled 2013-04-04: qty 2

## 2013-04-04 MED ORDER — ONDANSETRON 4 MG PO TBDP: 4.0000 mg | ORAL_TABLET | Freq: Four times a day (QID) | ORAL | Status: DC | PRN

## 2013-04-04 MED ORDER — LOPERAMIDE HCL 2 MG PO CAPS
4.0000 mg | ORAL_CAPSULE | Freq: Once | ORAL | Status: AC
  Administered 2013-04-04: 4 mg via ORAL
  Filled 2013-04-04: qty 2

## 2013-04-04 MED ORDER — SODIUM CHLORIDE 0.9 % IV SOLN: 1000.0000 mL | INTRAVENOUS | Status: DC

## 2013-04-04 NOTE — ED Notes (Signed)
Patient ambulated up and down hallway with minimal assistance.  No dizziness or lightheadedness noted.

## 2013-04-04 NOTE — ED Provider Notes (Signed)
CSN: [id number]     Arrival date & time 04/04/13  0008 History     First MD Initiated Contact with Patient 04/04/13 0036     Chief Complaint  Patient presents with  . Emesis  . Loss of Consciousness  . Diarrhea   HPI Donald Cole is a 52 y.o. male who presents with nausea vomiting and diarrhea, and a syncopal episode while vomiting. Patient says he eats hot dogs about 1:00 in the morning, he woke up this morning with nausea and vomiting. He's also had associated diarrhea throughout the day, as are frequent watery stools without any blood. He has taken some loperamide with some relief of diarrhea. He has noted some mild incontinence of stool due to the watery nature. He says at one point he was vomiting, ringing his hands against the wall, vomited forcefully and then had a syncopal episode. He denies any pain, denies any headache. He denies any chest pains, shortness of breath, he has some crampy diffuse abdominal pains, that are intermittent. Denies any dysuria, frequency.  Past Medical History  Diagnosis Date  . Hypertension   . Hypercholesterolemia   . Coronary artery disease    History reviewed. No pertinent past surgical history. No family history on file. History  Substance Use Topics  . Smoking status: Never Smoker   . Smokeless tobacco: Never Used  . Alcohol Use: No    Review of Systems At least 10pt or greater review of systems completed and are negative except where specified in the HPI.  Allergies  Review of patient's allergies indicates no known allergies.  Home Medications   Current Outpatient Rx  Name  Route  Sig  Dispense  Refill  . ibuprofen (ADVIL,MOTRIN) 600 MG tablet   Oral   Take 1 tablet (600 mg total) by mouth every 6 (six) hours as needed for pain.   15 tablet   0   . loperamide (IMODIUM A-D) 2 MG tablet   Oral   Take 2 mg by mouth 4 (four) times daily as needed for diarrhea or loose stools.          BP 70/52  Temp(Src) 97.4 F (36.3 C)  (Axillary)  Resp 18  SpO2 99% Physical Exam  Nursing notes reviewed.  Electronic medical record reviewed. VITAL SIGNS:   Filed Vitals:   04/04/13 0040  BP: 70/52  Temp: 97.4 F (36.3 C)  TempSrc: Axillary  Resp: 18  SpO2: 99%   CONSTITUTIONAL: Awake, oriented, appears non-toxic HENT: Atraumatic, normocephalic, oral mucosa pink and moist, airway patent. Nares patent without drainage. External ears normal. EYES: Conjunctiva clear, EOMI, PERRLA NECK: Trachea midline, non-tender, supple CARDIOVASCULAR: Normal heart rate, Normal rhythm, No murmurs, rubs, gallops PULMONARY/CHEST: Clear to auscultation, no rhonchi, wheezes, or rales. Symmetrical breath sounds. Non-tender. ABDOMINAL: Non-distended, soft, non-tender - no rebound or guarding.  BS normal. NEUROLOGIC: Non-focal, moving all four extremities, no gross sensory or motor deficits. EXTREMITIES: No clubbing, cyanosis, or edema SKIN: Warm, Dry, No erythema, No rash  ED Course   Procedures (including critical care time)  Date: 04/06/2013  Rate: 70  Rhythm: sinus rhythm  QRS Axis: normal  Intervals: Prolonged PR  ST/T Wave abnormalities: normal  Conduction Disutrbances: none  Narrative Interpretation: Slightly prolonged PR, no significant morphological changes when compared with prior EKG, nonischemic  Labs Reviewed  CBC WITH DIFFERENTIAL - Abnormal; Notable for the following:    WBC 14.4 (*)    Neutrophils Relative % 80 (*)    Neutro Abs  11.5 (*)    All other components within normal limits  URINALYSIS, ROUTINE W REFLEX MICROSCOPIC - Abnormal; Notable for the following:    APPearance TURBID (*)    Bilirubin Urine SMALL (*)    Protein, ur 100 (*)    All other components within normal limits  GLUCOSE, CAPILLARY - Abnormal; Notable for the following:    Glucose-Capillary 148 (*)    All other components within normal limits  URINE MICROSCOPIC-ADD ON - Abnormal; Notable for the following:    Bacteria, UA MANY (*)     Casts GRANULAR CAST (*)    Crystals CA OXALATE CRYSTALS (*)    All other components within normal limits  POCT I-STAT, CHEM 8 - Abnormal; Notable for the following:    Creatinine, Ser 1.90 (*)    Glucose, Bld 151 (*)    All other components within normal limits  POCT I-STAT TROPONIN I   No results found. 1. Nausea vomiting and diarrhea   2. Vaso vagal episode    Medications  ondansetron (ZOFRAN-ODT) disintegrating tablet 8 mg (8 mg Oral Given 04/04/13 0032)  loperamide (IMODIUM) capsule 4 mg (4 mg Oral Given 04/04/13 0131)  0.9 %  sodium chloride infusion (0 mLs Intravenous Stopped 04/04/13 0253)    Followed by  0.9 %  sodium chloride infusion (0 mLs Intravenous Stopped 04/04/13 0438)    MDM  Patient presents with nausea vomiting and diarrhea, he attributes these symptoms to eating some hot dogs. Patient had a syncopal episode while vomiting - patient's neurologic exam is unremarkable, he is able to get up and walk, is received some fluids and feels much better after medication including Zofran, loperamide and fluid. Patient has not had any chest pain any associated shortness of breath, did have some mild postural dizziness, that is gone with fluid.  The patient was mildly dehydrated from his bout of nausea vomiting and diarrhea.  I do not think his syncope is related to a heart arrhythmia, MI - clinical history is consistent with a vagal episode with vomiting.  I do not think this is an atypical presentation of ACS, EKG was checked, troponin is negative, EKG is unremarkable and unchanged from prior EKG. Other labs are unremarkable with the exception of a slight increase of his creatinine, this is likely secondary to dehydration. We'll have the patient followup with his primary care physician, for creatinine recheck in followup.  Patient discharged home stable and in good condition, patient understands and accepts the medical plan as it's been dictated and agrees with it. I have answered all his  questions to his satisfaction. He'll go home with some Zofran, and can continue to take loperamide as long as he does not have any high fevers, blood in the stools, he understands these instructions.  Jones Skene, MD 04/06/13 1610

## 2013-04-04 NOTE — ED Notes (Addendum)
Pt reports eating hotdogs last night around 0100. Woke this am with nv. Reports syncope while vomiting. Mentions loss control of bowels. nv continued through out the day. BP noted to be low in triage. Alert, NAD, calm, generally weak. Pt alert, NAD, calm, interactive, skin cool and dry. Last emesis PTA.

## 2013-08-04 ENCOUNTER — Encounter (HOSPITAL_COMMUNITY): Payer: Self-pay | Admitting: Emergency Medicine

## 2013-08-04 ENCOUNTER — Emergency Department (INDEPENDENT_AMBULATORY_CARE_PROVIDER_SITE_OTHER)
Admission: EM | Admit: 2013-08-04 | Discharge: 2013-08-04 | Disposition: A | Discharge status: Home / Self Care | Payer: Self-pay | Source: Home / Self Care | Attending: Family Medicine | Admitting: Family Medicine

## 2013-08-04 DIAGNOSIS — I10 Essential (primary) hypertension: Secondary | ICD-10-CM

## 2013-08-04 NOTE — ED Notes (Signed)
C/o chest pain with a strong beat States he has SOB

## 2013-08-04 NOTE — ED Provider Notes (Signed)
Donald Cole is a 52 y.o. male who presents to Urgent Care today for chest discomfort. Over the past several days patient has been aware of his heart beat. He denies any significant chest pain palpitations chest tightness squeezing or significant shortness of breath. He denies any radiating pain or exertional symptoms. He thinks perhaps his blood pressure is a bit high. He notes that he is pretty inconsistent with his blood pressure medication. He cannot recall his blood pressure medication is and is not sure which pharmacy he gets it filled at. He denies any lightheadedness dizziness or syncope.   Past Medical History  Diagnosis Date  . Hypertension   . Hypercholesterolemia   . Coronary artery disease    History  Substance Use Topics  . Smoking status: Never Smoker   . Smokeless tobacco: Never Used  . Alcohol Use: No   ROS as above Medications reviewed. No current facility-administered medications for this encounter.   Current Outpatient Prescriptions  Medication Sig Dispense Refill  . amLODipine-benazepril (LOTREL) 5-20 MG per capsule Take 1 capsule by mouth daily.      Marland Kitchen aspirin EC 81 MG tablet Take 81 mg by mouth daily.      . ondansetron (ZOFRAN ODT) 4 MG disintegrating tablet Take 1 tablet (4 mg total) by mouth every 6 (six) hours as needed for nausea.  10 tablet  0  . pravastatin (PRAVACHOL) 20 MG tablet Take 20 mg by mouth daily.        Exam:  BP 167/98  Pulse 76  Temp(Src) 98.2 F (36.8 C) (Oral)  Resp 18  SpO2 98%  Gen: Well NAD HEENT: EOMI,  MMM Lungs: Normal work of breathing. CTABL Heart: RRR no MRG Abd: NABS, Soft. NT, ND Exts: Non edematous BL  LE, warm and well perfused.   Twelve-lead EKG shows normal sinus rhythm at 69 beats per minute. Not significantly changed from prior EKG. Poor R wave progression. No results found for this or any previous visit (from the past 24 hour(s)). No results found.  Assessment and Plan: 52 y.o. male with elevated blood  pressure. Patient is aware of his heart beat.  This may be due to elevated blood pressure. He does not have any anginal symptoms currently. I believe the patient is not taking his blood pressure medication at all. I advised him to restart his medications and followup with his primary care provider ASAP. Carefully reviewed angina precautions.  Discussed warning signs or symptoms. Please see discharge instructions. Patient expresses understanding.       Rodolph Bong, MD 08/04/13 [phone]

## 2013-11-11 ENCOUNTER — Emergency Department (HOSPITAL_BASED_OUTPATIENT_CLINIC_OR_DEPARTMENT_OTHER)
Admission: EM | Admit: 2013-11-11 | Discharge: 2013-11-12 | Disposition: A | Discharge status: Home / Self Care | Payer: Self-pay | Attending: Emergency Medicine | Admitting: Emergency Medicine

## 2013-11-11 ENCOUNTER — Emergency Department (HOSPITAL_BASED_OUTPATIENT_CLINIC_OR_DEPARTMENT_OTHER): Payer: Self-pay

## 2013-11-11 ENCOUNTER — Encounter (HOSPITAL_BASED_OUTPATIENT_CLINIC_OR_DEPARTMENT_OTHER): Payer: Self-pay | Admitting: Emergency Medicine

## 2013-11-11 DIAGNOSIS — B9789 Other viral agents as the cause of diseases classified elsewhere: Secondary | ICD-10-CM | POA: Insufficient documentation

## 2013-11-11 DIAGNOSIS — Z7982 Long term (current) use of aspirin: Secondary | ICD-10-CM | POA: Insufficient documentation

## 2013-11-11 DIAGNOSIS — I10 Essential (primary) hypertension: Secondary | ICD-10-CM | POA: Insufficient documentation

## 2013-11-11 DIAGNOSIS — Z79899 Other long term (current) drug therapy: Secondary | ICD-10-CM | POA: Insufficient documentation

## 2013-11-11 DIAGNOSIS — J069 Acute upper respiratory infection, unspecified: Secondary | ICD-10-CM | POA: Insufficient documentation

## 2013-11-11 DIAGNOSIS — E7800 Pure hypercholesterolemia, unspecified: Secondary | ICD-10-CM | POA: Insufficient documentation

## 2013-11-11 DIAGNOSIS — I2510 Atherosclerotic heart disease of native coronary artery without angina pectoris: Secondary | ICD-10-CM | POA: Insufficient documentation

## 2013-11-11 MED ORDER — IBUPROFEN 800 MG PO TABS: 800.0000 mg | ORAL_TABLET | Freq: Three times a day (TID) | ORAL | Status: DC

## 2013-11-11 MED ORDER — ACETAMINOPHEN 325 MG PO TABS
650.0000 mg | ORAL_TABLET | Freq: Once | ORAL | Status: AC
  Administered 2013-11-11: 650 mg via ORAL
  Filled 2013-11-11: qty 2

## 2013-11-11 MED ORDER — IBUPROFEN 800 MG PO TABS
ORAL_TABLET | ORAL | Status: AC
  Administered 2013-11-11: 800 mg
  Filled 2013-11-11: qty 1

## 2013-11-11 NOTE — ED Notes (Signed)
Fever, congestion, and cough

## 2013-11-11 NOTE — ED Provider Notes (Signed)
CSN: [id number]     Arrival date & time 11/11/13  1933 History  This chart was scribed for Heavan Francom Smitty Cords, MD by Luisa Dago, ED Scribe. This patient was seen in room MH03/MH03 and the patient's care was started at 11:06 PM.    Chief Complaint  Patient presents with  . Fever   Patient is a 53 y.o. male presenting with URI. The history is provided by the patient. No language interpreter was used.  URI Presenting symptoms: congestion, cough and fever   Presenting symptoms: no rhinorrhea and no sore throat   Cough:    Cough characteristics:  Non-productive   Severity:  Mild   Onset quality:  Gradual   Duration:  4 days   Timing:  Sporadic   Progression:  Unchanged   Chronicity:  New Fever:    Timing:  Intermittent   Temp source:  Unable to specify Onset quality:  Gradual Timing:  Intermittent Progression:  Unchanged Chronicity:  New Relieved by:  Nothing Worsened by:  Nothing tried Ineffective treatments:  None tried Associated symptoms: no headaches, no swollen glands and no wheezing   Risk factors: not elderly    HPI Comments: Donald Cole is a 53 y.o. male who presents to the Emergency Department complaining of fever that started 4 days ago. Pt does not recall his home TMAX. Current ED temp is 102.3. Pt is also complaining of associated congestion, cough. Denies any dysuria.  One episode of diarrhea  Past Medical History  Diagnosis Date  . Hypertension   . Hypercholesterolemia   . Coronary artery disease    History reviewed. No pertinent past surgical history. History reviewed. No pertinent family history. History  Substance Use Topics  . Smoking status: Never Smoker   . Smokeless tobacco: Never Used  . Alcohol Use: No    Review of Systems  Constitutional: Positive for fever.  HENT: Positive for congestion. Negative for drooling, rhinorrhea, sinus pressure, sore throat, trouble swallowing and voice change.   Respiratory: Positive for cough. Negative  for wheezing.   Gastrointestinal: Positive for diarrhea.  Neurological: Negative for headaches.  All other systems reviewed and are negative.      Allergies  Review of patient's allergies indicates no known allergies.  Home Medications   Current Outpatient Rx  Name  Route  Sig  Dispense  Refill  . amLODipine-benazepril (LOTREL) 5-20 MG per capsule   Oral   Take 1 capsule by mouth daily.         . pravastatin (PRAVACHOL) 20 MG tablet   Oral   Take 20 mg by mouth daily.         Marland Kitchen aspirin EC 81 MG tablet   Oral   Take 81 mg by mouth daily.         . ondansetron (ZOFRAN ODT) 4 MG disintegrating tablet   Oral   Take 1 tablet (4 mg total) by mouth every 6 (six) hours as needed for nausea.   10 tablet   0    Triage Vitals:BP 148/88  Pulse 90  Temp(Src) 99.5 F (37.5 C) (Oral)  Resp 20  SpO2 98%  Physical Exam  Nursing note and vitals reviewed. Constitutional: He is oriented to person, place, and time. He appears well-developed and well-nourished. No distress.  HENT:  Head: Normocephalic and atraumatic.  Right Ear: External ear normal.  Left Ear: External ear normal.  Nose: Nose normal.  Mouth/Throat: Oropharynx is clear and moist. No oropharyngeal exudate.  Eyes: Conjunctivae are  normal. Pupils are equal, round, and reactive to light. No scleral icterus.  Neck: Normal range of motion. Neck supple. No thyromegaly present.  Cardiovascular: Normal rate, regular rhythm and normal heart sounds.  Exam reveals no gallop and no friction rub.   No murmur heard. Pulmonary/Chest: Effort normal and breath sounds normal. No respiratory distress. He has no wheezes. He has no rales. He exhibits no tenderness.  Abdominal: Soft. Bowel sounds are normal. He exhibits no distension. There is no tenderness. There is no rebound and no guarding.  Musculoskeletal: Normal range of motion. He exhibits no edema.  Lymphadenopathy:    He has no cervical adenopathy.  Neurological: He is  alert and oriented to person, place, and time.  Skin: Skin is warm and dry. No rash noted. He is not diaphoretic.  Psychiatric: He has a normal mood and affect. His behavior is normal.    ED Course  Procedures (including critical care time)  DIAGNOSTIC STUDIES: Oxygen Saturation is 98% on RA, normal by my interpretation.    COORDINATION OF CARE: 11:09 PM- Advised pt to alternate with Ibuprofen and Tylenol to keep his fever down. Pt advised of plan for treatment and pt agrees. Medications  acetaminophen (TYLENOL) tablet 650 mg (650 mg Oral Given 11/11/13 1948)    Labs Review Labs Reviewed - No data to display Imaging Review Dg Chest 2 View  11/11/2013   CLINICAL DATA:  Fever for 2 days  EXAM: CHEST  2 VIEW  COMPARISON:  DG CHEST 2 VIEW dated 07/11/2011; DG CHEST 2 VIEW dated 12/23/2007  FINDINGS: The heart size and mediastinal contours are within normal limits. Both lungs are clear. The visualized skeletal structures are unremarkable.  IMPRESSION: No active cardiopulmonary disease.   Electronically Signed   By: Elige KoHetal  Patel   On: 11/11/2013 20:43     EKG Interpretation None      MDM   Final diagnoses:  None  Viral syndrome.    Alternate tylenol and ibuprofen for fever, mucinex plain for congestion.  Follow up in 2 days with your PMD for recheck.     I personally performed the services described in this documentation, which was scribed in my presence. The recorded information has been reviewed and is accurate.     Jasmine AweApril K Jenifer Struve-Rasch, MD 11/12/13 [phone]

## 2013-11-11 NOTE — Discharge Instructions (Signed)
Cool Mist Vaporizers °Vaporizers may help relieve the symptoms of a cough and cold. They add moisture to the air, which helps mucus to become thinner and less sticky. This makes it easier to breathe and cough up secretions. Cool mist vaporizers do not cause serious burns like hot mist vaporizers ("steamers, humidifiers"). Vaporizers have not been proved to show they help with colds. You should not use a vaporizer if you are allergic to mold.  °HOME CARE INSTRUCTIONS °· Follow the package instructions for the vaporizer. °· Do not use anything other than distilled water in the vaporizer. °· Do not run the vaporizer all of the time. This can cause mold or bacteria to grow in the vaporizer. °· Clean the vaporizer after each time it is used. °· Clean and dry the vaporizer well before storing it. °· Stop using the vaporizer if worsening respiratory symptoms develop. °Document Released: 05/24/2004 Document Revised: 04/29/2013 Document Reviewed: 01/14/2013 °ExitCare® Patient Information ©2014 [employer]. ° °Cough, Adult ° A cough is a reflex. It helps you clear your throat and airways. A cough can help heal your body. A cough can last 2 or 3 weeks (acute) or may last more than 8 weeks (chronic). Some common causes of a cough can include an infection, allergy, or a cold. °HOME CARE °· Only take medicine as told by your doctor. °· If given, take your medicines (antibiotics) as told. Finish them even if you start to feel better. °· Use a cold steam vaporizer or humidier in your home. This can help loosen thick spit (secretions). °· Sleep so you are almost sitting up (semi-upright). Use pillows to do this. This helps reduce coughing. °· Rest as needed. °· Stop smoking if you smoke. °GET HELP RIGHT AWAY IF: °· You have yellowish-white fluid (pus) in your thick spit. °· Your cough gets worse. °· Your medicine does not reduce coughing, and you are losing sleep. °· You cough up blood. °· You have trouble breathing. °· Your pain  gets worse and medicine does not help. °· You have a fever. °MAKE SURE YOU:  °· Understand these instructions. °· Will watch your condition. °· Will get help right away if you are not doing well or get worse. °Document Released: 05/10/2011 Document Revised: 11/19/2011 Document Reviewed: 05/10/2011 °ExitCare® Patient Information ©2014 [employer]. ° °

## 2013-11-13 ENCOUNTER — Emergency Department (HOSPITAL_COMMUNITY)
Admission: EM | Admit: 2013-11-13 | Discharge: 2013-11-14 | Disposition: A | Discharge status: Home / Self Care | Payer: Self-pay | Attending: Emergency Medicine | Admitting: Emergency Medicine

## 2013-11-13 ENCOUNTER — Encounter (HOSPITAL_COMMUNITY): Payer: Self-pay | Admitting: Emergency Medicine

## 2013-11-13 DIAGNOSIS — I10 Essential (primary) hypertension: Secondary | ICD-10-CM | POA: Insufficient documentation

## 2013-11-13 DIAGNOSIS — Z7982 Long term (current) use of aspirin: Secondary | ICD-10-CM | POA: Insufficient documentation

## 2013-11-13 DIAGNOSIS — E7800 Pure hypercholesterolemia, unspecified: Secondary | ICD-10-CM | POA: Insufficient documentation

## 2013-11-13 DIAGNOSIS — J111 Influenza due to unidentified influenza virus with other respiratory manifestations: Secondary | ICD-10-CM | POA: Insufficient documentation

## 2013-11-13 DIAGNOSIS — Z79899 Other long term (current) drug therapy: Secondary | ICD-10-CM | POA: Insufficient documentation

## 2013-11-13 DIAGNOSIS — I2510 Atherosclerotic heart disease of native coronary artery without angina pectoris: Secondary | ICD-10-CM | POA: Insufficient documentation

## 2013-11-13 DIAGNOSIS — R69 Illness, unspecified: Secondary | ICD-10-CM

## 2013-11-13 MED ORDER — ALBUTEROL SULFATE (2.5 MG/3ML) 0.083% IN NEBU
5.0000 mg | INHALATION_SOLUTION | Freq: Once | RESPIRATORY_TRACT | Status: AC
  Administered 2013-11-14: 5 mg via RESPIRATORY_TRACT
  Filled 2013-11-13: qty 6

## 2013-11-13 MED ORDER — SODIUM CHLORIDE 0.9 % IV BOLUS (SEPSIS)
1000.0000 mL | Freq: Once | INTRAVENOUS | Status: AC
  Administered 2013-11-14: 1000 mL via INTRAVENOUS

## 2013-11-13 NOTE — ED Notes (Signed)
Pt c/o fever, cough, congestion, n/v x 5 days.

## 2013-11-14 ENCOUNTER — Emergency Department (HOSPITAL_COMMUNITY): Payer: Self-pay

## 2013-11-14 LAB — I-STAT CHEM 8, ED
BUN: 18 mg/dL (ref 6–23)
Calcium, Ion: 1.08 mmol/L — ABNORMAL LOW (ref 1.12–1.23)
Chloride: 98 mEq/L (ref 96–112)
Creatinine, Ser: 1.1 mg/dL (ref 0.50–1.35)
Glucose, Bld: 95 mg/dL (ref 70–99)
HCT: 45 % (ref 39.0–52.0)
Hemoglobin: 15.3 g/dL (ref 13.0–17.0)
Potassium: 4.1 mEq/L (ref 3.7–5.3)
Sodium: 137 mEq/L (ref 137–147)
TCO2: 28 mmol/L (ref 0–100)

## 2013-11-14 MED ORDER — PREDNISONE 50 MG PO TABS: 50.0000 mg | ORAL_TABLET | Freq: Every day | ORAL | Status: DC

## 2013-11-14 MED ORDER — ALBUTEROL SULFATE HFA 108 (90 BASE) MCG/ACT IN AERS: 2.0000 | INHALATION_SPRAY | RESPIRATORY_TRACT | Status: DC | PRN

## 2013-11-14 MED ORDER — GUAIFENESIN ER 1200 MG PO TB12: 1.0000 | ORAL_TABLET | Freq: Two times a day (BID) | ORAL | Status: DC

## 2013-11-14 MED ORDER — PROMETHAZINE-DM 6.25-15 MG/5ML PO SYRP: 5.0000 mL | ORAL_SOLUTION | Freq: Four times a day (QID) | ORAL | Status: DC | PRN

## 2013-11-14 NOTE — ED Provider Notes (Signed)
Medical screening examination/treatment/procedure(s) were performed by non-physician practitioner and as supervising physician I was immediately available for consultation/collaboration.   EKG Interpretation None        Julie Manly, MD 11/14/13 0711 

## 2013-11-14 NOTE — ED Provider Notes (Signed)
CSN: [id number]     Arrival date & time 11/13/13  2211 History   First MD Initiated Contact with Patient 11/13/13 2350     Chief Complaint  Patient presents with  . Fever  . Cough     (Consider location/radiation/quality/duration/timing/severity/associated sxs/prior Treatment) HPI Patient tends to the emergency department with a five-day history of fever, cough, nasal congestion, body aches, with nausea.  Patient, states, that he is taking Motrin and Tylenol for the fever.  Patient, states, the fever will go down, but returns.  Patient, states he's not had any chest chest pain, shortness of breath, no diarrhea, weakness, dizziness, headache, blurred vision, numbness, rash or syncope.  Patient, states, that he has not taken any other medications.  He states he was seen in the emergency department 2 days, ago and told that he had an upper respiratory illness.  Chest x-ray was done at that time, which didn't show any abnormality.  He states nothing seems to make his condition, better or worse Past Medical History  Diagnosis Date  . Hypertension   . Hypercholesterolemia   . Coronary artery disease    History reviewed. No pertinent past surgical history. No family history on file. History  Substance Use Topics  . Smoking status: Never Smoker   . Smokeless tobacco: Never Used  . Alcohol Use: No    Review of Systems   All other systems negative except as documented in the HPI. All pertinent positives and negatives as reviewed in the HPI.  Allergies  Review of patient's allergies indicates no known allergies.  Home Medications   Current Outpatient Rx  Name  Route  Sig  Dispense  Refill  . acetaminophen (TYLENOL) 500 MG tablet   Oral   Take 500 mg by mouth every 6 (six) hours as needed.         Marland Kitchen amLODipine-benazepril (LOTREL) 5-20 MG per capsule   Oral   Take 1 capsule by mouth daily.         Marland Kitchen aspirin EC 81 MG tablet   Oral   Take 81 mg by mouth daily.         Marland Kitchen  ibuprofen (ADVIL,MOTRIN) 800 MG tablet   Oral   Take 1 tablet (800 mg total) by mouth 3 (three) times daily.   21 tablet   0   . ondansetron (ZOFRAN ODT) 4 MG disintegrating tablet   Oral   Take 1 tablet (4 mg total) by mouth every 6 (six) hours as needed for nausea.   10 tablet   0   . pravastatin (PRAVACHOL) 20 MG tablet   Oral   Take 20 mg by mouth daily.          BP 155/90  Pulse 72  Temp(Src) 99.5 F (37.5 C) (Oral)  Resp 18  Ht 5\' 6"  (1.676 m)  Wt 215 lb (97.523 kg)  BMI 34.72 kg/m2  SpO2 99% Physical Exam  Nursing note and vitals reviewed. Constitutional: He is oriented to person, place, and time. He appears well-developed and well-nourished. No distress.  HENT:  Head: Normocephalic and atraumatic.  Mouth/Throat: Oropharynx is clear and moist.  Eyes: Pupils are equal, round, and reactive to light.  Neck: Normal range of motion. Neck supple.  Cardiovascular: Normal rate, regular rhythm and normal heart sounds.  Exam reveals no gallop and no friction rub.   No murmur heard. Pulmonary/Chest: Effort normal and breath sounds normal. No respiratory distress. He has no wheezes.  Neurological: He is alert and  oriented to person, place, and time. He exhibits normal muscle tone. Coordination normal.  Skin: Skin is warm and dry.    ED Course  Procedures (including critical care time) Labs Review Labs Reviewed  I-STAT CHEM 8, ED - Abnormal; Notable for the following:    Calcium, Ion 1.08 (*)    All other components within normal limits   She has an influenza-like illness, and will be treated for this.  Patient is advised to increase his fluid intake, and rest as much possible.  Patient is a rest return here as needed.  Advised patient that the symptoms could last for several weeks and to continue the Tylenol and Motrin.  Also advised the patient.  Followup with his primary care Dr. for recheck   Carlyle Dollyhristopher W Aracely Rickett, PA-C 11/14/13 [phone]

## 2013-11-14 NOTE — Discharge Instructions (Signed)
Return here as needed.  Followup with your primary care Dr. increase her fluid intake, rest as much possible.  Tylenol and Motrin for pain and fever

## 2014-05-22 ENCOUNTER — Encounter (HOSPITAL_BASED_OUTPATIENT_CLINIC_OR_DEPARTMENT_OTHER): Payer: Self-pay | Admitting: Emergency Medicine

## 2014-05-22 ENCOUNTER — Emergency Department (HOSPITAL_BASED_OUTPATIENT_CLINIC_OR_DEPARTMENT_OTHER): Payer: Self-pay

## 2014-05-22 DIAGNOSIS — I10 Essential (primary) hypertension: Secondary | ICD-10-CM | POA: Insufficient documentation

## 2014-05-22 DIAGNOSIS — Z79899 Other long term (current) drug therapy: Secondary | ICD-10-CM | POA: Insufficient documentation

## 2014-05-22 DIAGNOSIS — N200 Calculus of kidney: Secondary | ICD-10-CM | POA: Insufficient documentation

## 2014-05-22 DIAGNOSIS — R109 Unspecified abdominal pain: Secondary | ICD-10-CM | POA: Insufficient documentation

## 2014-05-22 DIAGNOSIS — IMO0002 Reserved for concepts with insufficient information to code with codable children: Secondary | ICD-10-CM | POA: Insufficient documentation

## 2014-05-22 DIAGNOSIS — E7800 Pure hypercholesterolemia, unspecified: Secondary | ICD-10-CM | POA: Insufficient documentation

## 2014-05-22 DIAGNOSIS — I2510 Atherosclerotic heart disease of native coronary artery without angina pectoris: Secondary | ICD-10-CM | POA: Insufficient documentation

## 2014-05-22 DIAGNOSIS — Z7982 Long term (current) use of aspirin: Secondary | ICD-10-CM | POA: Insufficient documentation

## 2014-05-22 LAB — URINE MICROSCOPIC-ADD ON

## 2014-05-22 LAB — URINALYSIS, ROUTINE W REFLEX MICROSCOPIC
Bilirubin Urine: NEGATIVE
GLUCOSE, UA: NEGATIVE mg/dL
Ketones, ur: 15 mg/dL — AB
LEUKOCYTES UA: NEGATIVE
Nitrite: NEGATIVE
Protein, ur: 30 mg/dL — AB
SPECIFIC GRAVITY, URINE: 1.024 (ref 1.005–1.030)
Urobilinogen, UA: 1 mg/dL (ref 0.0–1.0)
pH: 5.5 (ref 5.0–8.0)

## 2014-05-22 NOTE — ED Notes (Signed)
Pt reports left flank pain for past two days, more severe in last few hours. No nausea/vomiting. Pt obviously uncomfortable.

## 2014-05-23 ENCOUNTER — Encounter (HOSPITAL_BASED_OUTPATIENT_CLINIC_OR_DEPARTMENT_OTHER): Payer: Self-pay | Admitting: Emergency Medicine

## 2014-05-23 ENCOUNTER — Emergency Department (HOSPITAL_BASED_OUTPATIENT_CLINIC_OR_DEPARTMENT_OTHER)
Admission: EM | Admit: 2014-05-23 | Discharge: 2014-05-23 | Disposition: A | Discharge status: Home / Self Care | Payer: Self-pay | Attending: Emergency Medicine | Admitting: Emergency Medicine

## 2014-05-23 DIAGNOSIS — N200 Calculus of kidney: Secondary | ICD-10-CM

## 2014-05-23 LAB — CBC WITH DIFFERENTIAL/PLATELET
Basophils Absolute: 0.1 10*3/uL (ref 0.0–0.1)
Basophils Relative: 0 % (ref 0–1)
Eosinophils Absolute: 0.1 10*3/uL (ref 0.0–0.7)
Eosinophils Relative: 1 % (ref 0–5)
HCT: 41.6 % (ref 39.0–52.0)
Hemoglobin: 13.9 g/dL (ref 13.0–17.0)
LYMPHS ABS: 2.6 10*3/uL (ref 0.7–4.0)
LYMPHS PCT: 17 % (ref 12–46)
MCH: 29.2 pg (ref 26.0–34.0)
MCHC: 33.4 g/dL (ref 30.0–36.0)
MCV: 87.4 fL (ref 78.0–100.0)
Monocytes Absolute: 0.9 10*3/uL (ref 0.1–1.0)
Monocytes Relative: 6 % (ref 3–12)
Neutro Abs: 11.5 10*3/uL — ABNORMAL HIGH (ref 1.7–7.7)
Neutrophils Relative %: 76 % (ref 43–77)
PLATELETS: 265 10*3/uL (ref 150–400)
RBC: 4.76 MIL/uL (ref 4.22–5.81)
RDW: 13.1 % (ref 11.5–15.5)
WBC: 15.1 10*3/uL — AB (ref 4.0–10.5)

## 2014-05-23 LAB — COMPREHENSIVE METABOLIC PANEL
ALBUMIN: 4 g/dL (ref 3.5–5.2)
ALT: 26 U/L (ref 0–53)
AST: 20 U/L (ref 0–37)
Alkaline Phosphatase: 67 U/L (ref 39–117)
Anion gap: 16 — ABNORMAL HIGH (ref 5–15)
BUN: 20 mg/dL (ref 6–23)
CALCIUM: 9.5 mg/dL (ref 8.4–10.5)
CO2: 23 mEq/L (ref 19–32)
CREATININE: 1.5 mg/dL — AB (ref 0.50–1.35)
Chloride: 101 mEq/L (ref 96–112)
GFR calc Af Amer: 60 mL/min — ABNORMAL LOW (ref >90)
GFR calc non Af Amer: 52 mL/min — ABNORMAL LOW (ref >90)
Glucose, Bld: 139 mg/dL — ABNORMAL HIGH (ref 70–99)
Potassium: 4 mEq/L (ref 3.7–5.3)
Sodium: 140 mEq/L (ref 137–147)
TOTAL PROTEIN: 8.1 g/dL (ref 6.0–8.3)
Total Bilirubin: 0.3 mg/dL (ref 0.3–1.2)

## 2014-05-23 MED ORDER — KETOROLAC TROMETHAMINE 30 MG/ML IJ SOLN
30.0000 mg | Freq: Once | INTRAMUSCULAR | Status: AC
  Administered 2014-05-23: 30 mg via INTRAVENOUS
  Filled 2014-05-23: qty 1

## 2014-05-23 MED ORDER — ONDANSETRON 8 MG PO TBDP: ORAL_TABLET | ORAL | Status: DC

## 2014-05-23 MED ORDER — TAMSULOSIN HCL 0.4 MG PO CAPS
ORAL_CAPSULE | ORAL | Status: AC
  Filled 2014-05-23: qty 1

## 2014-05-23 MED ORDER — TAMSULOSIN HCL 0.4 MG PO CAPS: 0.4000 mg | ORAL_CAPSULE | Freq: Every day | ORAL | Status: DC

## 2014-05-23 MED ORDER — OXYCODONE-ACETAMINOPHEN 5-325 MG PO TABS: 1.0000 | ORAL_TABLET | Freq: Four times a day (QID) | ORAL | Status: DC | PRN

## 2014-05-23 MED ORDER — TAMSULOSIN HCL 0.4 MG PO CAPS
0.4000 mg | ORAL_CAPSULE | Freq: Every day | ORAL | Status: DC
  Administered 2014-05-23: 0.4 mg via ORAL

## 2014-05-23 MED ORDER — ONDANSETRON HCL 4 MG/2ML IJ SOLN
4.0000 mg | Freq: Once | INTRAMUSCULAR | Status: AC
  Administered 2014-05-23: 4 mg via INTRAVENOUS
  Filled 2014-05-23: qty 2

## 2014-05-23 MED ORDER — OXYCODONE-ACETAMINOPHEN 5-325 MG PO TABS
1.0000 | ORAL_TABLET | Freq: Once | ORAL | Status: AC
  Administered 2014-05-23: 1 via ORAL

## 2014-05-23 MED ORDER — IBUPROFEN 800 MG PO TABS: 800.0000 mg | ORAL_TABLET | Freq: Three times a day (TID) | ORAL | Status: DC

## 2014-05-23 MED ORDER — OXYCODONE-ACETAMINOPHEN 5-325 MG PO TABS
ORAL_TABLET | ORAL | Status: AC
  Filled 2014-05-23: qty 1

## 2014-05-23 NOTE — Discharge Instructions (Signed)
Dietary Guidelines to Help Prevent Kidney Stones  Your risk of kidney stones can be decreased by adjusting the foods you eat. The most important thing you can do is drink enough fluid. You should drink enough fluid to keep your urine clear or pale yellow. The following guidelines provide specific information for the type of kidney stone you have had.  GUIDELINES ACCORDING TO TYPE OF KIDNEY STONE  Calcium Oxalate Kidney Stones  · Reduce the amount of salt you eat. Foods that have a lot of salt cause your body to release excess calcium into your urine. The excess calcium can combine with a substance called oxalate to form kidney stones.  · Reduce the amount of animal protein you eat if the amount you eat is excessive. Animal protein causes your body to release excess calcium into your urine. Ask your dietitian how much protein from animal sources you should be eating.  · Avoid foods that are high in oxalates. If you take vitamins, they should have less than 500 mg of vitamin C. Your body turns vitamin C into oxalates. You do not need to avoid fruits and vegetables high in vitamin C.  Calcium Phosphate Kidney Stones  · Reduce the amount of salt you eat to help prevent the release of excess calcium into your urine.  · Reduce the amount of animal protein you eat if the amount you eat is excessive. Animal protein causes your body to release excess calcium into your urine. Ask your dietitian how much protein from animal sources you should be eating.  · Get enough calcium from food or take a calcium supplement (ask your dietitian for recommendations). Food sources of calcium that do not increase your risk of kidney stones include:  ¨ Broccoli.  ¨ Dairy products, such as cheese and yogurt.  ¨ Pudding.  Uric Acid Kidney Stones  · Do not have more than 6 oz of animal protein per day.  FOOD SOURCES  Animal Protein Sources  · Meat (all types).  · Poultry.  · Eggs.  · Fish, seafood.  Foods High in Salt  · Salt seasonings.  · Soy  sauce.  · Teriyaki sauce.  · Cured and processed meats.  · Salted crackers and snack foods.  · Fast food.  · Canned soups and most canned foods.  Foods High in Oxalates  · Grains:  ¨ Amaranth.  ¨ Barley.  ¨ Grits.  ¨ Wheat germ.  ¨ Bran.  ¨ Buckwheat flour.  ¨ All bran cereals.  ¨ Pretzels.  ¨ Whole wheat bread.  · Vegetables:  ¨ Beans (wax).  ¨ Beets and beet greens.  ¨ Collard greens.  ¨ Eggplant.  ¨ Escarole.  ¨ Leeks.  ¨ Okra.  ¨ Parsley.  ¨ Rutabagas.  ¨ Spinach.  ¨ Swiss chard.  ¨ Tomato paste.  ¨ Fried potatoes.  ¨ Sweet potatoes.  · Fruits:  ¨ Red currants.  ¨ Figs.  ¨ Kiwi.  ¨ Rhubarb.  · Meat and Other Protein Sources:  ¨ Beans (dried).  ¨ Soy burgers and other soybean products.  ¨ Miso.  ¨ Nuts (peanuts, almonds, pecans, cashews, hazelnuts).  ¨ Nut butters.  ¨ Sesame seeds and tahini (paste made of sesame seeds).  ¨ Poppy seeds.  · Beverages:  ¨ Chocolate drink mixes.  ¨ Soy milk.  ¨ Instant iced tea.  ¨ Juices made from high-oxalate fruits or vegetables.  · Other:  ¨ Carob.  ¨ Chocolate.  ¨ Fruitcake.  ¨ Marmalades.  Document Released:   12/22/2010 Document Revised: 09/01/2013 Document Reviewed: 07/24/2013  ExitCare® Patient Information ©2015 [employer]. This information is not intended to replace advice given to you by your health care provider. Make sure you discuss any questions you have with your health care provider.

## 2014-05-23 NOTE — ED Provider Notes (Signed)
CSN: [id number]     Arrival date & time 05/22/14  2240 History   First MD Initiated Contact with Patient 05/23/14 0041     Chief Complaint  Patient presents with  . Flank Pain     (Consider location/radiation/quality/duration/timing/severity/associated sxs/prior Treatment) Patient is a 53 y.o. male presenting with flank pain. The history is provided by the patient.  Flank Pain This is a recurrent problem. The current episode started 2 days ago. The problem occurs constantly. The problem has been gradually worsening. Pertinent negatives include no chest pain, no headaches and no shortness of breath. Nothing aggravates the symptoms. Nothing relieves the symptoms. He has tried nothing for the symptoms. The treatment provided no relief.    Past Medical History  Diagnosis Date  . Hypertension   . Hypercholesterolemia   . Coronary artery disease    History reviewed. No pertinent past surgical history. No family history on file. History  Substance Use Topics  . Smoking status: Never Smoker   . Smokeless tobacco: Never Used  . Alcohol Use: No    Review of Systems  Respiratory: Negative for shortness of breath.   Cardiovascular: Negative for chest pain.  Genitourinary: Positive for flank pain.  Neurological: Negative for headaches.  All other systems reviewed and are negative.     Allergies  Review of patient's allergies indicates no known allergies.  Home Medications   Prior to Admission medications   Medication Sig Start Date End Date Taking? Authorizing Provider  acetaminophen (TYLENOL) 500 MG tablet Take 500 mg by mouth every 6 (six) hours as needed.    Historical Provider, MD  albuterol (PROVENTIL HFA;VENTOLIN HFA) 108 (90 BASE) MCG/ACT inhaler Inhale 2 puffs into the lungs every 4 (four) hours as needed for wheezing or shortness of breath. 11/14/13   Jamesetta Orleans Lawyer, PA-C  amLODipine-benazepril (LOTREL) 5-20 MG per capsule Take 1 capsule by mouth daily.    Historical  Provider, MD  aspirin EC 81 MG tablet Take 81 mg by mouth daily.    Historical Provider, MD  Guaifenesin 1200 MG TB12 Take 1 tablet (1,200 mg total) by mouth 2 (two) times daily. 11/14/13   Jamesetta Orleans Lawyer, PA-C  ibuprofen (ADVIL,MOTRIN) 800 MG tablet Take 1 tablet (800 mg total) by mouth 3 (three) times daily. 11/11/13   Josalyn Dettmann K Mikhaela Zaugg-Rasch, MD  ondansetron (ZOFRAN ODT) 4 MG disintegrating tablet Take 1 tablet (4 mg total) by mouth every 6 (six) hours as needed for nausea. 04/04/13   John-Adam Bonk, MD  pravastatin (PRAVACHOL) 20 MG tablet Take 20 mg by mouth daily.    Historical Provider, MD  predniSONE (DELTASONE) 50 MG tablet Take 1 tablet (50 mg total) by mouth daily. 11/14/13   Jamesetta Orleans Lawyer, PA-C  promethazine-dextromethorphan (PROMETHAZINE-DM) 6.25-15 MG/5ML syrup Take 5 mLs by mouth 4 (four) times daily as needed for cough. 11/14/13   Jamesetta Orleans Lawyer, PA-C   BP 178/103  Pulse 67  Temp(Src) 98.1 F (36.7 C) (Oral)  Resp 16  Ht  (1.727 m)  Wt 215 lb (97.523 kg)  BMI 32.70 kg/m2  SpO2 98% Physical Exam  Constitutional: He is oriented to person, place, and time. He appears well-developed and well-nourished. No distress.  HENT:  Head: Normocephalic and atraumatic.  Mouth/Throat: Oropharynx is clear and moist.  Eyes: Pupils are equal, round, and reactive to light.  Neck: Normal range of motion. Neck supple.  Cardiovascular: Normal rate, regular rhythm and intact distal pulses.   Pulmonary/Chest: Effort normal and breath sounds  normal. He has no wheezes. He has no rales.  Abdominal: Soft. Bowel sounds are normal. There is no tenderness. There is no rebound and no guarding.  Musculoskeletal: Normal range of motion.  Neurological: He is alert and oriented to person, place, and time.  Skin: Skin is warm.  Psychiatric: He has a normal mood and affect.    ED Course  Procedures (including critical care time) Labs Review Labs Reviewed  URINALYSIS, ROUTINE W REFLEX  MICROSCOPIC - Abnormal; Notable for the following:    Color, Urine AMBER (*)    APPearance CLOUDY (*)    Hgb urine dipstick LARGE (*)    Ketones, ur 15 (*)    Protein, ur 30 (*)    All other components within normal limits  COMPREHENSIVE METABOLIC PANEL - Abnormal; Notable for the following:    Glucose, Bld 139 (*)    Creatinine, Ser 1.50 (*)    GFR calc non Af Amer 52 (*)    GFR calc Af Amer 60 (*)    Anion gap 16 (*)    All other components within normal limits  CBC WITH DIFFERENTIAL - Abnormal; Notable for the following:    WBC 15.1 (*)    Neutro Abs 11.5 (*)    All other components within normal limits  URINE MICROSCOPIC-ADD ON    Imaging Review Ct Abdomen Pelvis Wo Contrast  05/23/2014   CLINICAL DATA:  Left flank pain for 2 days.  EXAM: CT ABDOMEN AND PELVIS WITHOUT CONTRAST  TECHNIQUE: Multidetector CT imaging of the abdomen and pelvis was performed following the standard protocol without IV contrast.  COMPARISON:  CT of the abdomen and pelvis from 03/12/2010  FINDINGS: The visualized lung bases are clear.  The liver and spleen are unremarkable in appearance. The gallbladder is within normal limits. The pancreas and adrenal glands are unremarkable.  There is minimal left-sided hydronephrosis, with an obstructing 5 x 3 mm stone noted in the distal left ureter, approximately 4 cm above the left vesicoureteral junction. Mild left-sided perinephric stranding is noted.  A 2.7 cm right renal cyst is seen posteriorly. Scattered small nonobstructing right renal stones are seen, measuring up to 4 mm in size.  No free fluid is identified. The small bowel is unremarkable in appearance. The stomach is within normal limits. No acute vascular abnormalities are seen. Minimal calcification is seen along the abdominal aorta and its branches.  The appendix is normal in caliber, without evidence for appendicitis. Scattered diverticulosis is noted along the proximal sigmoid colon, without evidence of  diverticulitis.  The bladder is relatively decompressed and grossly unremarkable in appearance. No inguinal lymphadenopathy is seen.  No acute osseous abnormalities are identified.  IMPRESSION: 1. Minimal left-sided hydronephrosis, with an obstructing 5 x 3 mm stone in the distal left ureter, 4 cm above the left vesicoureteral junction. 2. 2.7 cm right renal cyst noted. Scattered small nonobstructing right renal stones measure up to 4 mm in size. 3. Scattered diverticulosis along the proximal sigmoid colon, without evidence of diverticulitis.   Electronically Signed   By: Roanna Raider M.D.   On: 05/23/2014 00:08     EKG Interpretation None      MDM   Final diagnoses:  None    Painfree post medication.  Will start percocet flomax NSAIDs and zofran.  Will d/c with strainer and urology follow up in 7 days.  Patient verbalizes understanding and agrees to follow up    Clerance Umland Smitty Cords, MD 05/23/14 1610

## 2014-11-14 DIAGNOSIS — Z7982 Long term (current) use of aspirin: Secondary | ICD-10-CM | POA: Insufficient documentation

## 2014-11-14 DIAGNOSIS — S29001A Unspecified injury of muscle and tendon of front wall of thorax, initial encounter: Secondary | ICD-10-CM | POA: Insufficient documentation

## 2014-11-14 DIAGNOSIS — X58XXXA Exposure to other specified factors, initial encounter: Secondary | ICD-10-CM | POA: Insufficient documentation

## 2014-11-14 DIAGNOSIS — Z79899 Other long term (current) drug therapy: Secondary | ICD-10-CM | POA: Insufficient documentation

## 2014-11-14 DIAGNOSIS — Y9389 Activity, other specified: Secondary | ICD-10-CM | POA: Insufficient documentation

## 2014-11-14 DIAGNOSIS — I2510 Atherosclerotic heart disease of native coronary artery without angina pectoris: Secondary | ICD-10-CM | POA: Insufficient documentation

## 2014-11-14 DIAGNOSIS — Y9289 Other specified places as the place of occurrence of the external cause: Secondary | ICD-10-CM | POA: Insufficient documentation

## 2014-11-14 DIAGNOSIS — Y998 Other external cause status: Secondary | ICD-10-CM | POA: Insufficient documentation

## 2014-11-14 DIAGNOSIS — E780 Pure hypercholesterolemia: Secondary | ICD-10-CM | POA: Insufficient documentation

## 2014-11-14 DIAGNOSIS — I10 Essential (primary) hypertension: Secondary | ICD-10-CM | POA: Insufficient documentation

## 2014-11-15 ENCOUNTER — Encounter (HOSPITAL_COMMUNITY): Payer: Self-pay | Admitting: *Deleted

## 2014-11-15 ENCOUNTER — Emergency Department (HOSPITAL_COMMUNITY)
Admission: EM | Admit: 2014-11-15 | Discharge: 2014-11-15 | Disposition: A | Discharge status: Home / Self Care | Payer: Self-pay | Attending: Emergency Medicine | Admitting: Emergency Medicine

## 2014-11-15 ENCOUNTER — Emergency Department (HOSPITAL_COMMUNITY): Payer: Self-pay

## 2014-11-15 DIAGNOSIS — R0781 Pleurodynia: Secondary | ICD-10-CM

## 2014-11-15 MED ORDER — CYCLOBENZAPRINE HCL 5 MG PO TABS: 5.0000 mg | ORAL_TABLET | Freq: Three times a day (TID) | ORAL | Status: DC | PRN

## 2014-11-15 MED ORDER — HYDROCODONE-ACETAMINOPHEN 5-325 MG PO TABS: 1.0000 | ORAL_TABLET | ORAL | Status: DC | PRN

## 2014-11-15 NOTE — ED Provider Notes (Signed)
CSN: [id number]     Arrival date & time 11/14/14  2355 History   First MD Initiated Contact with Patient 11/15/14 0050     Chief Complaint  Patient presents with  . Rib Injury     (Consider location/radiation/quality/duration/timing/severity/associated sxs/prior Treatment) HPI Comments: Patient states he was gagging and felt a pop in his right rib area .  He tried taking ibuprofen, without any relief of his pain.  He states that several weeks ago.  He was tying his shoe and felt a similar pain in his left rib area and was seen at [facility] after that with a negative chest x-ray as well  The history is provided by the patient.    Past Medical History  Diagnosis Date  . Hypertension   . Hypercholesterolemia   . Coronary artery disease    History reviewed. No pertinent past surgical history. No family history on file. History  Substance Use Topics  . Smoking status: Never Smoker   . Smokeless tobacco: Never Used  . Alcohol Use: No    Review of Systems  Respiratory: Negative for shortness of breath and wheezing.   Cardiovascular: Positive for chest pain.  All other systems reviewed and are negative.     Allergies  Review of patient's allergies indicates no known allergies.  Home Medications   Prior to Admission medications   Medication Sig Start Date End Date Taking? Authorizing Provider  acetaminophen (TYLENOL) 500 MG tablet Take 500 mg by mouth every 6 (six) hours as needed.    Historical Provider, MD  albuterol (PROVENTIL HFA;VENTOLIN HFA) 108 (90 BASE) MCG/ACT inhaler Inhale 2 puffs into the lungs every 4 (four) hours as needed for wheezing or shortness of breath. 11/14/13   Jamesetta Orleans Lawyer, PA-C  amLODipine-benazepril (LOTREL) 5-20 MG per capsule Take 1 capsule by mouth daily.    Historical Provider, MD  aspirin EC 81 MG tablet Take 81 mg by mouth daily.    Historical Provider, MD  cyclobenzaprine (FLEXERIL) 5 MG tablet Take 1 tablet (5 mg total) by mouth  3 (three) times daily as needed for muscle spasms. 11/15/14   Arman Filter, NP  Guaifenesin 1200 MG TB12 Take 1 tablet (1,200 mg total) by mouth 2 (two) times daily. 11/14/13   Carlyle Dolly, PA-C  HYDROcodone-acetaminophen (NORCO/VICODIN) 5-325 MG per tablet Take 1 tablet by mouth every 4 (four) hours as needed. 11/15/14   Arman Filter, NP  ibuprofen (ADVIL,MOTRIN) 800 MG tablet Take 1 tablet (800 mg total) by mouth 3 (three) times daily. 11/11/13   April K Palumbo-Rasch, MD  ibuprofen (ADVIL,MOTRIN) 800 MG tablet Take 1 tablet (800 mg total) by mouth 3 (three) times daily. 05/23/14   April K Palumbo-Rasch, MD  ondansetron (ZOFRAN ODT) 4 MG disintegrating tablet Take 1 tablet (4 mg total) by mouth every 6 (six) hours as needed for nausea. 04/04/13   John-Adam Bonk, MD  ondansetron (ZOFRAN ODT) 8 MG disintegrating tablet  ODT q8 hours prn nausea 05/23/14   April K Palumbo-Rasch, MD  oxyCODONE-acetaminophen (PERCOCET) 5-325 MG per tablet Take 1-2 tablets by mouth every 6 (six) hours as needed. 05/23/14   April K Palumbo-Rasch, MD  pravastatin (PRAVACHOL) 20 MG tablet Take 20 mg by mouth daily.    Historical Provider, MD  predniSONE (DELTASONE) 50 MG tablet Take 1 tablet (50 mg total) by mouth daily. 11/14/13   Jamesetta Orleans Lawyer, PA-C  promethazine-dextromethorphan (PROMETHAZINE-DM) 6.25-15 MG/5ML syrup Take 5 mLs by mouth 4 (four) times  daily as needed for cough. 11/14/13   Jamesetta Orleanshristopher W Lawyer, PA-C  tamsulosin (FLOMAX) 0.4 MG CAPS capsule Take 1 capsule (0.4 mg total) by mouth daily. 05/23/14   April K Palumbo-Rasch, MD   BP 173/91 mmHg  Pulse 60  Temp(Src) 98 F (36.7 C) (Oral)  Resp 16  SpO2 97% Physical Exam  Constitutional: He appears well-developed and well-nourished.  HENT:  Head: Normocephalic.  Eyes: Pupils are equal, round, and reactive to light.  Neck: Normal range of motion.  Cardiovascular: Normal rate.   Pulmonary/Chest: Effort normal. He exhibits no tenderness.  Pain cannot be  reproduced with palpation  Nursing note and vitals reviewed.   ED Course  Procedures (including critical care time) Labs Review Labs Reviewed - No data to display  Imaging Review Dg Ribs Unilateral W/chest Right  11/15/2014   CLINICAL DATA:  Right rib pain. Nausea. Right side popped at 7 p.m. tonight.  EXAM: RIGHT RIBS AND CHEST - 3+ VIEW  COMPARISON:  10/19/2014  FINDINGS: Shallow inspiration. Normal heart size and pulmonary vascularity. No focal airspace disease or consolidation in the lungs. No blunting of costophrenic angles. No pneumothorax. Mediastinal contours appear intact.  Right ribs appear intact. No displaced fractures or focal bone lesions identified.  IMPRESSION: Negative.   Electronically Signed   By: Burman NievesWilliam  Stevens M.D.   On: 11/15/2014 01:29     EKG Interpretation None     Chest x-ray is normal, not showing any pneumonia.  Rib fractures or underlying pathology to cause this patient's pain has been given Flexeril and Vicodin.  Follow-up with his primary care physician as needed MDM   Final diagnoses:  Rib pain on right side         Arman FilterGail K Yamel Bale, NP 11/15/14 0139  Olivia Mackielga M Otter, MD 11/15/14 [id number]

## 2014-11-15 NOTE — ED Notes (Addendum)
Pt states that he felt he needed to vomit and was gagging this pm and felt a "pop" to right lower rib; pt c/o pain to right lower rib area

## 2014-11-15 NOTE — Discharge Instructions (Signed)
Your x-ray does not show any pneumonia.  Rib fractures or pathology to be causing your discomfort, You have been given a muscle relaxer, as well as Vicodin for pain control.  Please make an appointment with your physician for follow-up and further evaluation

## 2014-11-24 ENCOUNTER — Encounter (HOSPITAL_BASED_OUTPATIENT_CLINIC_OR_DEPARTMENT_OTHER): Payer: Self-pay | Admitting: *Deleted

## 2014-11-24 ENCOUNTER — Emergency Department (HOSPITAL_BASED_OUTPATIENT_CLINIC_OR_DEPARTMENT_OTHER)
Admission: EM | Admit: 2014-11-24 | Discharge: 2014-11-24 | Disposition: A | Discharge status: Home / Self Care | Payer: Self-pay | Attending: Emergency Medicine | Admitting: Emergency Medicine

## 2014-11-24 DIAGNOSIS — Z8639 Personal history of other endocrine, nutritional and metabolic disease: Secondary | ICD-10-CM | POA: Insufficient documentation

## 2014-11-24 DIAGNOSIS — R0981 Nasal congestion: Secondary | ICD-10-CM | POA: Insufficient documentation

## 2014-11-24 DIAGNOSIS — R519 Headache, unspecified: Secondary | ICD-10-CM

## 2014-11-24 DIAGNOSIS — R51 Headache: Secondary | ICD-10-CM | POA: Insufficient documentation

## 2014-11-24 DIAGNOSIS — R0789 Other chest pain: Secondary | ICD-10-CM | POA: Insufficient documentation

## 2014-11-24 DIAGNOSIS — I2510 Atherosclerotic heart disease of native coronary artery without angina pectoris: Secondary | ICD-10-CM | POA: Insufficient documentation

## 2014-11-24 DIAGNOSIS — R0781 Pleurodynia: Secondary | ICD-10-CM

## 2014-11-24 DIAGNOSIS — I10 Essential (primary) hypertension: Secondary | ICD-10-CM | POA: Insufficient documentation

## 2014-11-24 NOTE — ED Provider Notes (Signed)
CSN: [id number]     Arrival date & time 11/24/14  0014 History   First MD Initiated Contact with Patient 11/24/14 0133     Chief Complaint  Patient presents with  . Hypertension     (Consider location/radiation/quality/duration/timing/severity/associated sxs/prior Treatment) HPI Comments: 54 year old male with history of high blood pressure with medications, cholesterol recent rib injury presents for rib tenderness. Patient has had intermittent bilateral rib pain with sneezing and coughing. Patient feels the rib, of moves had a placement back in place with bending forward or sneezing. Currently no significant pain. Patient has had mild pressure in the head without acute headache, no chest pain no shortness of breath mild congestion. Patient has a primary doctor to follow-up for his high blood pressure. Patient has had recent weight gain no thyroid problems. Patient had recent blood work done.  Patient is a 54 y.o. male presenting with hypertension. The history is provided by the patient.  Hypertension Pertinent negatives include no chest pain, no abdominal pain, no headaches and no shortness of breath.    Past Medical History  Diagnosis Date  . Hypertension   . Hypercholesterolemia   . Coronary artery disease    History reviewed. No pertinent past surgical history. History reviewed. No pertinent family history. History  Substance Use Topics  . Smoking status: Never Smoker   . Smokeless tobacco: Never Used  . Alcohol Use: No    Review of Systems  Constitutional: Negative for fever and chills.  HENT: Positive for congestion.   Eyes: Negative for visual disturbance.  Respiratory: Negative for shortness of breath.   Cardiovascular: Negative for chest pain.  Gastrointestinal: Negative for vomiting and abdominal pain.  Genitourinary: Negative for dysuria and flank pain.  Musculoskeletal: Negative for back pain, neck pain and neck stiffness.  Skin: Negative for rash.  Neurological:  Negative for weakness, light-headedness, numbness and headaches.      Allergies  Review of patient's allergies indicates no known allergies.  Home Medications   Prior to Admission medications   Not on File   BP 186/102 mmHg  Pulse 60  Temp(Src) 98.5 F (36.9 C) (Oral)  Resp 18  Ht 5\' 8"  (1.727 m)  Wt 215 lb (97.523 kg)  BMI 32.70 kg/m2  SpO2 98% Physical Exam  Constitutional: He is oriented to person, place, and time. He appears well-developed and well-nourished.  HENT:  Head: Normocephalic and atraumatic.  Eyes: Conjunctivae are normal. Right eye exhibits no discharge. Left eye exhibits no discharge.  Neck: Normal range of motion. Neck supple. No tracheal deviation present.  Cardiovascular: Normal rate and regular rhythm.   Pulmonary/Chest: Effort normal and breath sounds normal.  Abdominal: Soft. He exhibits no distension. There is no tenderness. There is no guarding.  Musculoskeletal: He exhibits no edema or tenderness.  No focal pain of the ribs however patient points to lower rib margins bilateral for intermittent discomfort.  Neurological: He is alert and oriented to person, place, and time. He has normal strength. No cranial nerve deficit. GCS eye subscore is 4. GCS verbal subscore is 5. GCS motor subscore is 6.  Neck supple no meningismus.  Skin: Skin is warm. No rash noted.  Psychiatric: He has a normal mood and affect.  Nursing note and vitals reviewed.   ED Course  Procedures (including critical care time) Labs Review Labs Reviewed - No data to display  Imaging Review No results found.   EKG Interpretation None      MDM   Final diagnoses:  Essential  hypertension  Pain in rib  Pressure in head    Patient presents with multiple different concerns primarily felt he needed an MRI for why his ribs keep popping in and out with coughing.  Patient is high blood pressure and mild pressure in the head with normal neuro exam gradual onset.  I do not  feel patient needs any emergent testing at this time. We discussed weight loss close follow-up with primary Dr. for thyroid testing and blood pressure management. Patient understands reasons to return. Patient comfortable with this plan.  Results and differential diagnosis were discussed with the patient/parent/guardian. Close follow up outpatient was discussed, comfortable with the plan.   Medications - No data to display  Filed Vitals:   11/24/14 0026  BP: 186/102  Pulse: 60  Temp: 98.5 F (36.9 C)  TempSrc: Oral  Resp: 18  Height:  (1.727 m)  Weight: 215 lb (97.523 kg)  SpO2: 98%    Final diagnoses:  Essential hypertension  Pain in rib  Pressure in head      Blane Ohara, MD 11/24/14 [phone]

## 2014-11-24 NOTE — Discharge Instructions (Signed)
Please see your physician to discuss blood pressure management and weight loss strategies. If you develop acute headache, chest pain, shortness of breath or new concerns come to the ER.  If you were given medicines take as directed.  If you are on coumadin or contraceptives realize their levels and effectiveness is altered by many different medicines.  If you have any reaction (rash, tongues swelling, other) to the medicines stop taking and see a physician.   Please follow up as directed and return to the ER or see a physician for new or worsening symptoms.  Thank you. Filed Vitals:   11/24/14 0026  BP: 186/102  Pulse: 60  Temp: 98.5 F (36.9 C)  TempSrc: Oral  Resp: 18  Height: 5\' 8"  (1.727 m)  Weight: 215 lb (97.523 kg)  SpO2: 98%

## 2014-11-24 NOTE — ED Notes (Signed)
Pt here tonight 'to get an MRI for his ribs to find out what is in there'

## 2014-11-24 NOTE — ED Notes (Signed)
Pt reports HTN tonight 'related to the rib pain that he continues to have'.

## 2014-12-18 ENCOUNTER — Emergency Department (HOSPITAL_BASED_OUTPATIENT_CLINIC_OR_DEPARTMENT_OTHER)
Admission: EM | Admit: 2014-12-18 | Discharge: 2014-12-18 | Disposition: A | Discharge status: Home / Self Care | Payer: Self-pay | Attending: Emergency Medicine | Admitting: Emergency Medicine

## 2014-12-18 ENCOUNTER — Encounter (HOSPITAL_BASED_OUTPATIENT_CLINIC_OR_DEPARTMENT_OTHER): Payer: Self-pay | Admitting: Emergency Medicine

## 2014-12-18 DIAGNOSIS — I2510 Atherosclerotic heart disease of native coronary artery without angina pectoris: Secondary | ICD-10-CM | POA: Insufficient documentation

## 2014-12-18 DIAGNOSIS — J069 Acute upper respiratory infection, unspecified: Secondary | ICD-10-CM | POA: Insufficient documentation

## 2014-12-18 DIAGNOSIS — Z8639 Personal history of other endocrine, nutritional and metabolic disease: Secondary | ICD-10-CM | POA: Insufficient documentation

## 2014-12-18 DIAGNOSIS — I10 Essential (primary) hypertension: Secondary | ICD-10-CM | POA: Insufficient documentation

## 2014-12-18 MED ORDER — LIDOCAINE VISCOUS 2 % MT SOLN
15.0000 mL | Freq: Once | OROMUCOSAL | Status: AC
  Administered 2014-12-18: 15 mL via OROMUCOSAL

## 2014-12-18 MED ORDER — IBUPROFEN 800 MG PO TABS
800.0000 mg | ORAL_TABLET | Freq: Once | ORAL | Status: AC
  Administered 2014-12-18: 800 mg via ORAL

## 2014-12-18 MED ORDER — IBUPROFEN 800 MG PO TABS
ORAL_TABLET | ORAL | Status: AC
  Filled 2014-12-18: qty 1

## 2014-12-18 MED ORDER — IBUPROFEN 800 MG PO TABS: 800.0000 mg | ORAL_TABLET | Freq: Three times a day (TID) | ORAL | Status: DC

## 2014-12-18 MED ORDER — LORATADINE 10 MG PO TABS
ORAL_TABLET | ORAL | Status: AC
  Filled 2014-12-18: qty 1

## 2014-12-18 MED ORDER — FLUTICASONE PROPIONATE 50 MCG/ACT NA SUSP: 2.0000 | Freq: Every day | NASAL | Status: DC

## 2014-12-18 MED ORDER — CETIRIZINE HCL 10 MG PO TABS: 10.0000 mg | ORAL_TABLET | Freq: Every day | ORAL | Status: DC

## 2014-12-18 MED ORDER — LORATADINE 10 MG PO TABS
10.0000 mg | ORAL_TABLET | Freq: Once | ORAL | Status: AC
  Administered 2014-12-18: 10 mg via ORAL

## 2014-12-18 MED ORDER — LIDOCAINE VISCOUS 2 % MT SOLN
OROMUCOSAL | Status: AC
  Filled 2014-12-18: qty 15

## 2014-12-18 NOTE — ED Notes (Signed)
Pt c/o sore throat and congestion x 3 days.  

## 2014-12-18 NOTE — Discharge Instructions (Signed)
Cool Mist Vaporizers °Vaporizers may help relieve the symptoms of a cough and cold. They add moisture to the air, which helps mucus to become thinner and less sticky. This makes it easier to breathe and cough up secretions. Cool mist vaporizers do not cause serious burns like hot mist vaporizers, which may also be called steamers or humidifiers. Vaporizers have not been proven to help with colds. You should not use a vaporizer if you are allergic to mold. °HOME CARE INSTRUCTIONS °· Follow the package instructions for the vaporizer. °· Do not use anything other than distilled water in the vaporizer. °· Do not run the vaporizer all of the time. This can cause mold or bacteria to grow in the vaporizer. °· Clean the vaporizer after each time it is used. °· Clean and dry the vaporizer well before storing it. °· Stop using the vaporizer if worsening respiratory symptoms develop. °Document Released: 05/24/2004 Document Revised: 09/01/2013 Document Reviewed: 01/14/2013 °Exit[employer]® Patient Information ©2015 [employer]. This information is not intended to replace advice given to you by your health care provider. Make sure you discuss any questions you have with your health care provider. ° °

## 2014-12-18 NOTE — ED Provider Notes (Signed)
CSN: [id number]     Arrival date & time 12/18/14  0051 History   First MD Initiated Contact with Patient 12/18/14 0104     Chief Complaint  Patient presents with  . Sore Throat     (Consider location/radiation/quality/duration/timing/severity/associated sxs/prior Treatment) Patient is a 54 y.o. male presenting with pharyngitis. The history is provided by the patient.  Sore Throat This is a new problem. The current episode started more than 2 days ago. The problem occurs constantly. The problem has not changed since onset.Pertinent negatives include no chest pain, no abdominal pain, no headaches and no shortness of breath. Nothing aggravates the symptoms. Nothing relieves the symptoms. He has tried nothing for the symptoms. The treatment provided no relief.  Nasal congestion with drainage and cough.    Past Medical History  Diagnosis Date  . Hypertension   . Hypercholesterolemia   . Coronary artery disease    History reviewed. No pertinent past surgical history. History reviewed. No pertinent family history. History  Substance Use Topics  . Smoking status: Never Smoker   . Smokeless tobacco: Never Used  . Alcohol Use: No    Review of Systems  Respiratory: Negative for shortness of breath.   Cardiovascular: Negative for chest pain.  Gastrointestinal: Negative for abdominal pain.  Neurological: Negative for headaches.  All other systems reviewed and are negative.     Allergies  Review of patient's allergies indicates no known allergies.  Home Medications   Prior to Admission medications   Not on File   BP 178/99 mmHg  Pulse 68  Temp(Src) 99 F (37.2 C) (Oral)  Resp 18  Ht 5\' 8"  (1.727 m)  Wt 215 lb (97.523 kg)  BMI 32.70 kg/m2  SpO2 99% Physical Exam  Constitutional: He is oriented to person, place, and time. He appears well-developed and well-nourished.  HENT:  Head: Normocephalic and atraumatic.  Mouth/Throat: Oropharynx is clear and moist. No oropharyngeal  exudate.  Clear colorless post nasal drip  Eyes: Conjunctivae and EOM are normal. Pupils are equal, round, and reactive to light.  Neck: Normal range of motion. Neck supple.  Cardiovascular: Normal rate, regular rhythm and intact distal pulses.   Pulmonary/Chest: Effort normal and breath sounds normal. No respiratory distress. He has no wheezes. He has no rales.  Abdominal: Soft. Bowel sounds are normal. There is no tenderness. There is no rebound and no guarding.  Musculoskeletal: Normal range of motion.  Neurological: He is alert and oriented to person, place, and time.  Skin: Skin is warm and dry.  Psychiatric: He has a normal mood and affect.    ED Course  Procedures (including critical care time) Labs Review Labs Reviewed - No data to display  Imaging Review No results found.   EKG Interpretation None      MDM   Final diagnoses:  None    URI: will treat symptomatically.      Cy BlamerApril Kathryn Cosby, MD 12/18/14 [phone]

## 2014-12-18 NOTE — ED Notes (Signed)
C/o cough productive (green),  Congestion, sore throat fever x 3 days

## 2015-10-25 ENCOUNTER — Other Ambulatory Visit: Payer: Self-pay | Admitting: Internal Medicine

## 2015-10-25 DIAGNOSIS — R1011 Right upper quadrant pain: Secondary | ICD-10-CM

## 2015-10-26 ENCOUNTER — Other Ambulatory Visit: Payer: Self-pay | Admitting: Internal Medicine

## 2015-10-26 DIAGNOSIS — R053 Chronic cough: Secondary | ICD-10-CM

## 2015-10-26 DIAGNOSIS — R05 Cough: Secondary | ICD-10-CM

## 2015-11-03 ENCOUNTER — Ambulatory Visit (HOSPITAL_COMMUNITY)
Admission: RE | Admit: 2015-11-03 | Discharge: 2015-11-03 | Disposition: A | Discharge status: Home / Self Care | Payer: Self-pay | Source: Ambulatory Visit | Attending: Internal Medicine | Admitting: Internal Medicine

## 2015-11-03 ENCOUNTER — Other Ambulatory Visit (HOSPITAL_COMMUNITY): Payer: Self-pay

## 2015-11-03 DIAGNOSIS — R053 Chronic cough: Secondary | ICD-10-CM

## 2015-11-03 DIAGNOSIS — N200 Calculus of kidney: Secondary | ICD-10-CM | POA: Insufficient documentation

## 2015-11-03 DIAGNOSIS — R05 Cough: Secondary | ICD-10-CM | POA: Insufficient documentation

## 2015-11-03 DIAGNOSIS — R1011 Right upper quadrant pain: Secondary | ICD-10-CM

## 2015-11-03 DIAGNOSIS — R918 Other nonspecific abnormal finding of lung field: Secondary | ICD-10-CM | POA: Insufficient documentation

## 2015-11-03 DIAGNOSIS — K760 Fatty (change of) liver, not elsewhere classified: Secondary | ICD-10-CM | POA: Insufficient documentation

## 2016-04-16 ENCOUNTER — Encounter (HOSPITAL_COMMUNITY): Payer: Self-pay | Admitting: Family Medicine

## 2016-04-16 ENCOUNTER — Ambulatory Visit (HOSPITAL_COMMUNITY)
Admission: EM | Admit: 2016-04-16 | Discharge: 2016-04-16 | Disposition: A | Discharge status: Home / Self Care | Payer: Self-pay | Attending: Emergency Medicine | Admitting: Emergency Medicine

## 2016-04-16 DIAGNOSIS — J9801 Acute bronchospasm: Secondary | ICD-10-CM

## 2016-04-16 DIAGNOSIS — I10 Essential (primary) hypertension: Secondary | ICD-10-CM

## 2016-04-16 DIAGNOSIS — J069 Acute upper respiratory infection, unspecified: Secondary | ICD-10-CM

## 2016-04-16 MED ORDER — ALBUTEROL SULFATE HFA 108 (90 BASE) MCG/ACT IN AERS: 2.0000 | INHALATION_SPRAY | RESPIRATORY_TRACT | 0 refills | Status: DC | PRN

## 2016-04-16 MED ORDER — PREDNISONE 20 MG PO TABS: ORAL_TABLET | ORAL | 0 refills | Status: DC

## 2016-04-16 NOTE — ED Triage Notes (Addendum)
Pt here for head pressure that has been going on for a few days or more and then followed by sinus pain and congestion. sts he think his BP meds aren't working and feels like blood pressure is up. BP 188/116

## 2016-04-16 NOTE — ED Provider Notes (Signed)
CSN: [id number]     Arrival date & time 04/16/16  1836 History   First MD Initiated Contact with Patient 04/16/16 2012     Chief Complaint  Patient presents with  . Hypertension  . Nasal Congestion  . Headache   (Consider location/radiation/quality/duration/timing/severity/associated sxs/prior Treatment) 55 year old male with a history of hypertension is complaining of pressure in the back of his head that radiates to the vertex. He states that this is sometimes made worse when palpating the scalp or moving his head forward or "thinking too hard". He developed a head cold and chest cold approximately a week ago. He is complaining of cough, sinus congestion, PND and production of scant green mucus.  He states he does not know the name of his blood pressure medications. He saw his PCP 3-4 weeks ago and states that he does not believe his medications are working.  Denies headache, problems with vision, speech, hearing, swallowing, chest pain. He does have minor dyspnea.      Past Medical History:  Diagnosis Date  . Coronary artery disease   . Hypercholesterolemia   . Hypertension    History reviewed. No pertinent surgical history. History reviewed. No pertinent family history. Social History  Substance Use Topics  . Smoking status: Never Smoker  . Smokeless tobacco: Never Used  . Alcohol use No    Review of Systems  Constitutional: Positive for activity change. Negative for chills, diaphoresis, fatigue and fever.  HENT: Positive for congestion, postnasal drip and voice change. Negative for ear pain, facial swelling, rhinorrhea, sore throat and trouble swallowing.   Eyes: Negative for pain, discharge and redness.  Respiratory: Positive for cough. Negative for chest tightness and shortness of breath.   Cardiovascular: Negative.  Negative for chest pain, palpitations and leg swelling.  Gastrointestinal: Negative.   Musculoskeletal: Negative.  Negative for neck pain and neck  stiffness.  Skin: Negative.   Neurological: Negative.   Psychiatric/Behavioral: The patient is nervous/anxious.        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    Allergies  Review of patient's allergies indicates no known allergies.  Home Medications   Prior to Admission medications   Medication Sig Start Date End Date Taking? Authorizing Provider  albuterol (PROVENTIL HFA;VENTOLIN HFA) 108 (90 Base) MCG/ACT inhaler Inhale 2 puffs into the lungs every 4 (four) hours as needed for wheezing or shortness of breath. 04/16/16   Hayden Rasmussenavid Winslow Verrill, NP  cetirizine (ZYRTEC) 10 MG tablet Take 1 tablet (10 mg total) by mouth daily. 12/18/14   April Palumbo, MD  fluticasone [facility](FLONASE) 50 MCG/ACT nasal spray Place 2 sprays into both nostrils daily. 12/18/14   April Palumbo, MD  ibuprofen (ADVIL,MOTRIN) 800 MG tablet Take 1 tablet (800 mg total) by mouth 3 (three) times daily. 12/18/14   April Palumbo, MD  predniSONE (DELTASONE) 20 MG tablet 3 Tabs PO Days 1-3, then 2 tabs PO Days 4-6, then 1 tab PO Day 7-9, then Half Tab PO Day 10-12 04/16/16   Hayden Rasmussenavid Raizel Wesolowski, NP   Meds Ordered and Administered this Visit  Medications - No data to display  BP (!) 188/116   Pulse (!) 56   Temp 98 F (36.7 C)   Resp 18   SpO2 99%  No data found.   Physical Exam  Constitutional: He is oriented to person, place, and time. He appears well-developed. No distress.  HENT:  Head: Normocephalic and atraumatic.  Right Ear: External ear normal.  Left Ear: External ear normal.  Mouth/Throat: No oropharyngeal exudate.  Bilateral TMs are normal. Oropharynx with minor, light erythema with scant clear PND. No exudates. The swelling.  Tenderness to the scalp musculature particularly along the base of the occipital bones. No cervical tenderness. Demonstrates full range of motion of the neck.    Eyes: EOM are normal. Pupils are equal, round, and reactive to light.  Neck: Normal range of motion. Neck supple.  Cardiovascular: Normal  rate, regular rhythm, normal heart sounds and intact distal pulses.   No murmur heard. Pulmonary/Chest: Effort normal. No respiratory distress. He has wheezes. He has no rales.  End-expiratory wheeze. Mildly prolonged expiratory phase.  Musculoskeletal: Normal range of motion. He exhibits no edema.  Neurological: He is alert and oriented to person, place, and time. He has normal strength. No cranial nerve deficit or sensory deficit. Coordination normal. GCS eye subscore is 4. GCS verbal subscore is 5. GCS motor subscore is 6.  Skin: Skin is warm and dry.  Nursing note and vitals reviewed.   Urgent Care Course   Clinical Course    Procedures (including critical care time)  Labs Review Labs Reviewed - No data to display  Imaging Review No results found.   Visual Acuity Review  Right Eye Distance:   Left Eye Distance:   Bilateral Distance:    Right Eye Near:   Left Eye Near:    Bilateral Near:         MDM   1. Essential hypertension   2. Bronchospasm   3. URI (upper respiratory infection)    Meds ordered this encounter  Medications  . predniSONE (DELTASONE) 20 MG tablet    Sig: 3 Tabs PO Days 1-3, then 2 tabs PO Days 4-6, then 1 tab PO Day 7-9, then Half Tab PO Day 10-12    Dispense:  20 tablet    Refill:  0    Order Specific Question:   Supervising Provider    Answer:   Charm Rings [id number]  . albuterol (PROVENTIL HFA;VENTOLIN HFA) 108 (90 Base) MCG/ACT inhaler    Sig: Inhale 2 puffs into the lungs every 4 (four) hours as needed for wheezing or shortness of breath.    Dispense:  1 Inhaler    Refill:  0    Order Specific Question:   Supervising Provider    Answer:   Charm Rings [id number]   Continue taking the Zyrtec. Did not take decongestants due to your high blood pressure. Hopefully the prednisone will help with that. Use copious amounts of saline nasal spray. Drink plenty of fluids stay well-hydrated. follow-up with your primary care doctor sent. Call for  appointment. No neurologic symptoms or abnormal findings today. Medications as above. For worsening, new symptoms or problems, development of chest pain, headaches or problems with vision, speech, swallowing or weakness of the legs or arms could to the emergency department promptly.    Hayden Rasmussen, NP 04/16/16 2038

## 2016-08-14 ENCOUNTER — Emergency Department (HOSPITAL_BASED_OUTPATIENT_CLINIC_OR_DEPARTMENT_OTHER)
Admission: EM | Admit: 2016-08-14 | Discharge: 2016-08-14 | Disposition: A | Discharge status: Home / Self Care | Payer: Self-pay | Attending: Emergency Medicine | Admitting: Emergency Medicine

## 2016-08-14 ENCOUNTER — Encounter (HOSPITAL_BASED_OUTPATIENT_CLINIC_OR_DEPARTMENT_OTHER): Payer: Self-pay | Admitting: Emergency Medicine

## 2016-08-14 DIAGNOSIS — Z79899 Other long term (current) drug therapy: Secondary | ICD-10-CM | POA: Insufficient documentation

## 2016-08-14 DIAGNOSIS — I2510 Atherosclerotic heart disease of native coronary artery without angina pectoris: Secondary | ICD-10-CM | POA: Insufficient documentation

## 2016-08-14 DIAGNOSIS — I10 Essential (primary) hypertension: Secondary | ICD-10-CM | POA: Insufficient documentation

## 2016-08-14 DIAGNOSIS — G5602 Carpal tunnel syndrome, left upper limb: Secondary | ICD-10-CM | POA: Insufficient documentation

## 2016-08-14 MED ORDER — METHYLPREDNISOLONE 4 MG PO TBPK: ORAL_TABLET | ORAL | 0 refills | Status: DC

## 2016-08-14 MED ORDER — NAPROXEN 250 MG PO TABS
500.0000 mg | ORAL_TABLET | Freq: Once | ORAL | Status: AC
  Administered 2016-08-14: 500 mg via ORAL
  Filled 2016-08-14: qty 2

## 2016-08-14 NOTE — ED Notes (Signed)
Pt verbalizes understanding of d/c instructions and denies any further needs at this time. 

## 2016-08-14 NOTE — ED Triage Notes (Signed)
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 Patient reports that he has had the numbness and tingling to his left fingers and hand x about 1 year.

## 2016-08-14 NOTE — ED Provider Notes (Signed)
[facility]   CSN: [id number] Arrival date & time: 08/14/16  0039     History   Chief Complaint Chief Complaint  Patient presents with  . 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  HPI  This is a 55 year old male who presents with bilateral hand pain left greater than right. Patient reports one year of worsening hand pain. He feels it predominantly in the left hand. It is worse at night. He has tingling and "feeling like the circulation is cut off."  He reports numbness predominantly in the middle 3 digits of the left hand. He is left-handed. No injury. 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 Denies weakness.  Past Medical History:  Diagnosis Date  . Coronary artery disease   . Hypercholesterolemia   . Hypertension     There are no active problems to display for this patient.   History reviewed. No pertinent surgical history.     Home Medications    Prior to Admission medications   Medication Sig Start Date End Date Taking? Authorizing Provider  amLODipine (NORVASC) 5 MG tablet Take 5 mg by mouth daily.   Yes Historical Provider, MD  albuterol (PROVENTIL HFA;VENTOLIN HFA) 108 (90 Base) MCG/ACT inhaler Inhale 2 puffs into the lungs every 4 (four) hours as needed for wheezing or shortness of breath. 04/16/16   Hayden Rasmussenavid Mabe, NP  cetirizine (ZYRTEC) 10 MG tablet Take 1 tablet (10 mg total) by mouth daily. 12/18/14   April Palumbo, MD  fluticasone [facility](FLONASE) 50 MCG/ACT nasal spray Place 2 sprays into both nostrils daily. 12/18/14   April Palumbo, MD  ibuprofen (ADVIL,MOTRIN) 800 MG tablet Take 1 tablet (800 mg total) by mouth 3 (three) times daily. 12/18/14   April Palumbo, MD  methylPREDNISolone (MEDROL DOSEPAK) 4 MG TBPK tablet Take as directed on packet 08/14/16   Shon Batonourtney F Murl Golladay, MD  predniSONE (DELTASONE) 20 MG tablet 3 Tabs PO Days 1-3, then 2 tabs PO Days 4-6, then 1 tab PO Day 7-9, then Half Tab PO Day 10-12 04/16/16   Hayden Rasmussenavid Mabe, NP     Family History History reviewed. No pertinent family history.  Social History Social History  Substance Use Topics  . Smoking status: Never Smoker  . Smokeless tobacco: Never Used  . Alcohol use No     Allergies   Patient has no known allergies.   Review of Systems Review of Systems  Musculoskeletal:       Left greater than right hand pain  Skin: Negative for rash and wound.  Neurological: Positive for numbness. Negative for weakness.  All other systems reviewed and are negative.    Physical Exam Updated Vital Signs BP 134/78 (BP Location: Right Arm)   Pulse (!) 59   Temp 98.3 F (36.8 C) (Oral)   Resp 18   Ht 5\' 8"  (1.727 m)   Wt 215 lb (97.5 kg)   SpO2 97%   BMI 32.69 kg/m   Physical Exam  Constitutional: He is oriented to person, place, and time. He appears well-developed and well-nourished.  Overweight  HENT:  Head: Normocephalic and atraumatic.  Cardiovascular: Normal rate and regular rhythm.   Pulmonary/Chest: Effort normal. No respiratory distress.  Musculoskeletal: Normal range of motion. He exhibits no edema.  Normal range of motion of the wrist, flexion and extension of the fingers, median, ulnar, and radial nerves grossly intact, tenderness palpation of the left carpal tunnel  Neurological: He is alert and  oriented to person, place, and time.  Skin: Skin is warm and dry.  Psychiatric: He has a normal mood and affect.  Nursing note and vitals reviewed.    ED Treatments / Results  Labs (all labs ordered are listed, but only abnormal results are displayed) Labs Reviewed - No data to display  EKG  EKG Interpretation None       Radiology No results found.  Procedures Procedures (including critical care time)  Medications Ordered in ED Medications  naproxen (NAPROSYN) tablet 500 mg (500 mg Oral Given 08/14/16 0223)     Initial Impression / Assessment and Plan / ED Course  I have reviewed the triage vital signs and the nursing  notes.  Pertinent labs & imaging results that were available during my care of the patient were reviewed by me and considered in my medical decision making (see chart for details).  Clinical Course     Patient presents with symptoms most consistent with carpal tunnel syndrome. He is otherwise neurovascularly intact. He was placed in a Velcro splint. Be placed on a Medrol Dosepak. Follow-up with hand surgery if symptoms persist. Patient may need release.  After history, exam, and medical workup I feel the patient has been appropriately medically screened and is safe for discharge home. Pertinent diagnoses were discussed with the patient. Patient was given return precautions.   Final Clinical Impressions(s) / ED Diagnoses   Final diagnoses:  Carpal tunnel syndrome of left wrist    New Prescriptions Discharge Medication List as of 08/14/2016  2:12 AM    START taking these medications   Details  methylPREDNISolone (MEDROL DOSEPAK) 4 MG TBPK tablet Take as directed on packet, Print         Shon Batonourtney F Purnell Daigle, MD 08/14/16 [phone]

## 2017-02-05 ENCOUNTER — Emergency Department (HOSPITAL_COMMUNITY)
Admission: EM | Admit: 2017-02-05 | Discharge: 2017-02-06 | Disposition: A | Discharge status: Home / Self Care | Payer: Self-pay | Attending: Emergency Medicine | Admitting: Emergency Medicine

## 2017-02-05 ENCOUNTER — Encounter (HOSPITAL_COMMUNITY): Payer: Self-pay | Admitting: Emergency Medicine

## 2017-02-05 DIAGNOSIS — G9009 Other idiopathic peripheral autonomic neuropathy: Secondary | ICD-10-CM | POA: Insufficient documentation

## 2017-02-05 DIAGNOSIS — R590 Localized enlarged lymph nodes: Secondary | ICD-10-CM | POA: Insufficient documentation

## 2017-02-05 DIAGNOSIS — I10 Essential (primary) hypertension: Secondary | ICD-10-CM | POA: Insufficient documentation

## 2017-02-05 DIAGNOSIS — I2510 Atherosclerotic heart disease of native coronary artery without angina pectoris: Secondary | ICD-10-CM | POA: Insufficient documentation

## 2017-02-05 DIAGNOSIS — R600 Localized edema: Secondary | ICD-10-CM | POA: Insufficient documentation

## 2017-02-05 DIAGNOSIS — R609 Edema, unspecified: Secondary | ICD-10-CM

## 2017-02-05 DIAGNOSIS — Z79899 Other long term (current) drug therapy: Secondary | ICD-10-CM | POA: Insufficient documentation

## 2017-02-05 DIAGNOSIS — G629 Polyneuropathy, unspecified: Secondary | ICD-10-CM

## 2017-02-05 NOTE — ED Triage Notes (Addendum)
Patient c/o bilateral hand swelling/aching and "pressure" in his head and bilateral ears x1 day. Denies SOB, chest pain, abdominal pain,  N/V/D. Speaking in full sentences without difficulty.

## 2017-02-06 LAB — CBG MONITORING, ED: GLUCOSE-CAPILLARY: 118 mg/dL — AB (ref 65–99)

## 2017-02-06 MED ORDER — GABAPENTIN 300 MG PO CAPS: 300.0000 mg | ORAL_CAPSULE | Freq: Three times a day (TID) | ORAL | 0 refills | Status: DC

## 2017-02-06 NOTE — Discharge Instructions (Signed)
Take Gabapentin 300mg  three times a day for nerve pain Follow up with your doctor to discuss your blood pressure medicine Talk to your doctor about a biopsy of your lymph node Return for worsening symptoms

## 2017-02-06 NOTE — ED Notes (Signed)
Pt ambulatory and independent at discharge.  Verbalized understanding of discharge instructions 

## 2017-02-06 NOTE — ED Provider Notes (Signed)
[facility]-EMERGENCY DEPT Provider Note   CSN: [id number] Arrival date & time: 02/05/17  2130   History   Chief Complaint Chief Complaint  Patient presents with  . Joint Swelling    HPI Donald Cole is a 56 y.o. male who presents with multiple complaints. They symptoms have been going on for several days but worse today. He reports bilateral ear pressure and "heat" coming from his head. He complains of tender lymphadenopathy of the right ear. He also reports bilateral swelling of his hands and sometimes legs as well as numbness and tingling of his fingertips. He feels like shards of glass are cutting his fingers. He was diagnosed with carpal tunnel syndrome in Dec but states that this feels somewhat different because it involves his entire hand. He denies chest pain or SOB.   HPI  Past Medical History:  Diagnosis Date  . Coronary artery disease   . Hypercholesterolemia   . Hypertension     There are no active problems to display for this patient.   History reviewed. No pertinent surgical history.     Home Medications    Prior to Admission medications   Medication Sig Start Date End Date Taking? Authorizing Provider  albuterol (PROVENTIL HFA;VENTOLIN HFA) 108 (90 Base) MCG/ACT inhaler Inhale 2 puffs into the lungs every 4 (four) hours as needed for wheezing or shortness of breath. 04/16/16   Hayden Rasmussen, NP  amLODipine (NORVASC) 5 MG tablet Take 5 mg by mouth daily.    [provider]  cetirizine (ZYRTEC) 10 MG tablet Take 1 tablet (10 mg total) by mouth daily. 12/18/14   Palumbo, April, MD  fluticasone (FLONASE) 50 MCG/ACT nasal spray Place 2 sprays into both nostrils daily. 12/18/14   Palumbo, April, MD  gabapentin (NEURONTIN) 300 MG capsule Take 1 capsule (300 mg total) by mouth 3 (three) times daily. 02/06/17   Bethel Born, PA-C  ibuprofen (ADVIL,MOTRIN) 800 MG tablet Take 1 tablet (800 mg total) by mouth 3 (three) times daily. 12/18/14   Palumbo, April, MD    methylPREDNISolone (MEDROL DOSEPAK) 4 MG TBPK tablet Take as directed on packet 08/14/16   Horton, Mayer Masker, MD  predniSONE (DELTASONE) 20 MG tablet 3 Tabs PO Days 1-3, then 2 tabs PO Days 4-6, then 1 tab PO Day 7-9, then Half Tab PO Day 10-12 04/16/16   Hayden Rasmussen, NP    Family History History reviewed. No pertinent family history.  Social History Social History  Substance Use Topics  . Smoking status: Never Smoker  . Smokeless tobacco: Never Used  . Alcohol use No     Allergies   Patient has no known allergies.   Review of Systems Review of Systems  Constitutional: Negative for chills and fever.  HENT: Positive for ear pain (pressure). Negative for hearing loss and sinus pressure.   Respiratory: Negative for shortness of breath.   Cardiovascular: Negative for chest pain.  Gastrointestinal: Negative for abdominal pain.  Musculoskeletal: Positive for joint swelling and myalgias.  Neurological: Positive for numbness. Negative for weakness.  All other systems reviewed and are negative.    Physical Exam Updated Vital Signs BP (!) 163/97 (BP Location: Left Arm)   Pulse 72   Temp 98.3 F (36.8 C) (Oral)   Resp 16   SpO2 99%   Physical Exam  Constitutional: He is oriented to person, place, and time. He appears well-developed and well-nourished. No distress.  Pleasant, NAD  HENT:  Head: Normocephalic and atraumatic.  Right Ear: Hearing,  tympanic membrane, external ear and ear canal normal.  Left Ear: Hearing, tympanic membrane, external ear and ear canal normal.  +R preauricular lymphadenopathy  Eyes: Conjunctivae are normal. Pupils are equal, round, and reactive to light. Right eye exhibits no discharge. Left eye exhibits no discharge. No scleral icterus.  Neck: Normal range of motion.  Cardiovascular: Normal rate and regular rhythm.  Exam reveals no gallop and no friction rub.   No murmur heard. Pulmonary/Chest: Effort normal and breath sounds normal. No respiratory  distress. He has no wheezes. He has no rales. He exhibits no tenderness.  Abdominal: He exhibits no distension.  Musculoskeletal:  No obvious swelling, deformity, or warmth of hands. No tenderness to palpation. FROM of fingers. 5/5 strength. 2+ radial pulse. Subjective decreased sensation to bilateral fingertips.   Neurological: He is alert and oriented to person, place, and time.  Skin: Skin is warm and dry.  Psychiatric: He has a normal mood and affect. His behavior is normal.  Nursing note and vitals reviewed.    ED Treatments / Results  Labs (all labs ordered are listed, but only abnormal results are displayed) Labs Reviewed  CBG MONITORING, ED - Abnormal; Notable for the following:       Result Value   Glucose-Capillary 118 (*)    All other components within normal limits    EKG  EKG Interpretation None       Radiology No results found.  Procedures Procedures (including critical care time)  Medications Ordered in ED Medications - No data to display   Initial Impression / Assessment and Plan / ED Course  I have reviewed the triage vital signs and the nursing notes.  Pertinent labs & imaging results that were available during my care of the patient were reviewed by me and considered in my medical decision making (see chart for details).  56 year old male with multiple vague complaints. He is hypertensive but otherwise vitals are normal. He takes Amlodipine so this may be the cause of his swelling which seems minimal but is bothersome to him. He is also pre-diabetic so that may be causing neuropathy. Numbness is not in the distribution of median nerve so doubt carpal tunnel. Glucose was checked and was 118. He also has lymphadenopathy of his right ear which has been present for a long time. He may need a biopsy of this. Discussed that he needs to follow up with his PCP regarding these issues but will rx a trial of Gabapentin. He verbalized understanding. Return  precautions given.  Final Clinical Impressions(s) / ED Diagnoses   Final diagnoses:  Neuropathy  Peripheral edema  Preauricular lymphadenopathy    New Prescriptions Discharge Medication List as of 02/06/2017 12:24 AM    START taking these medications   Details  gabapentin (NEURONTIN) 300 MG capsule Take 1 capsule (300 mg total) by mouth 3 (three) times daily., Starting Wed 02/06/2017, Print         Bethel BornGekas, Novalyn Lajara Marie, PA-C 02/06/17 1127    Arby BarrettePfeiffer, Marcy, MD 02/08/17 1739

## 2017-02-06 NOTE — ED Notes (Signed)
ED Provider at bedside. 

## 2018-05-03 ENCOUNTER — Emergency Department (HOSPITAL_BASED_OUTPATIENT_CLINIC_OR_DEPARTMENT_OTHER)
Admission: EM | Admit: 2018-05-03 | Discharge: 2018-05-03 | Disposition: A | Discharge status: Home / Self Care | Payer: Self-pay | Attending: Emergency Medicine | Admitting: Emergency Medicine

## 2018-05-03 ENCOUNTER — Encounter (HOSPITAL_BASED_OUTPATIENT_CLINIC_OR_DEPARTMENT_OTHER): Payer: Self-pay

## 2018-05-03 ENCOUNTER — Other Ambulatory Visit: Payer: Self-pay

## 2018-05-03 DIAGNOSIS — J3489 Other specified disorders of nose and nasal sinuses: Secondary | ICD-10-CM | POA: Insufficient documentation

## 2018-05-03 DIAGNOSIS — Z79899 Other long term (current) drug therapy: Secondary | ICD-10-CM | POA: Insufficient documentation

## 2018-05-03 DIAGNOSIS — H6692 Otitis media, unspecified, left ear: Secondary | ICD-10-CM | POA: Insufficient documentation

## 2018-05-03 DIAGNOSIS — I10 Essential (primary) hypertension: Secondary | ICD-10-CM | POA: Insufficient documentation

## 2018-05-03 DIAGNOSIS — H6690 Otitis media, unspecified, unspecified ear: Secondary | ICD-10-CM

## 2018-05-03 MED ORDER — AMLODIPINE BESYLATE 5 MG PO TABS: 5.0000 mg | ORAL_TABLET | Freq: Every day | ORAL | 0 refills | Status: DC

## 2018-05-03 MED ORDER — AMOXICILLIN-POT CLAVULANATE 875-125 MG PO TABS: 1.0000 | ORAL_TABLET | Freq: Two times a day (BID) | ORAL | 0 refills | Status: AC

## 2018-05-03 MED ORDER — CARVEDILOL 6.25 MG PO TABS: 6.2500 mg | ORAL_TABLET | Freq: Two times a day (BID) | ORAL | 0 refills | Status: DC

## 2018-05-03 NOTE — ED Notes (Signed)
Medication list obtained from [facility] with verbal permission from patient

## 2018-05-03 NOTE — ED Provider Notes (Signed)
[facility] EMERGENCY DEPARTMENT Provider Note   CSN: [id number] Arrival date & time: 05/03/18  2100     History   Chief Complaint No chief complaint on file.   HPI Donald Cole is a 57 y.o. male.  HPI  Donald Cole is a 57yo male with a history of hypertension, hyperlipidemia, CAD, sinusitis who presents to the emergency department for evaluation of congestion, rhinorrhea, ear fullness.  He reports his symptoms began 2 days ago, typically gets this every fall.  Reports that he has significant rhinorrhea and postnasal drip which caused him to have a dry cough at times.  He denies shortness of breath or chest pain.  He reports that he also has pressure in his left ear which feels very full.  Has associated muffled hearing in the left ear.  He has been taking over-the-counter Coricidin cough and cold as well as afrin which helps, but only temporarily.  Denies sick contacts with similar symptoms.  Denies fevers, chills, headache, neck stiffness, sinus pressure, facial swelling, abdominal pain, vomiting, changes in urination, visual disturbance.  He reports that his previous PCP retired and he has not taken his blood pressure medication in some time.  He is unsure of the name of his blood pressure medication.  Past Medical History:  Diagnosis Date  . Coronary artery disease   . Hypercholesterolemia   . Hypertension     There are no active problems to display for this patient.   History reviewed. No pertinent surgical history.      Home Medications    Prior to Admission medications   Medication Sig Start Date End Date Taking? Authorizing Provider  albuterol (PROVENTIL HFA;VENTOLIN HFA) 108 (90 Base) MCG/ACT inhaler Inhale 2 puffs into the lungs every 4 (four) hours as needed for wheezing or shortness of breath. 04/16/16  Yes Mabe, Onalee Hua, NP  amLODipine (NORVASC) 5 MG tablet Take 5 mg by mouth daily.   Yes [provider]  cetirizine (ZYRTEC) 10 MG  tablet Take 1 tablet (10 mg total) by mouth daily. 12/18/14  Yes Palumbo, April, MD  fluticasone [facility]) 50 MCG/ACT nasal spray Place 2 sprays into both nostrils daily. 12/18/14  Yes Palumbo, April, MD  gabapentin (NEURONTIN) 300 MG capsule Take 1 capsule (300 mg total) by mouth 3 (three) times daily. 02/06/17  Yes Bethel Born, PA-C  ibuprofen (ADVIL,MOTRIN) 800 MG tablet Take 1 tablet (800 mg total) by mouth 3 (three) times daily. 12/18/14  Yes Palumbo, April, MD  methylPREDNISolone (MEDROL DOSEPAK) 4 MG TBPK tablet Take as directed on packet 08/14/16  Yes Horton, Mayer Masker, MD  predniSONE (DELTASONE) 20 MG tablet 3 Tabs PO Days 1-3, then 2 tabs PO Days 4-6, then 1 tab PO Day 7-9, then Half Tab PO Day 10-12 04/16/16  Yes Hayden Rasmussen, NP    Family History No family history on file.  Social History Social History   Tobacco Use  . Smoking status: Never Smoker  . Smokeless tobacco: Never Used  Substance Use Topics  . Alcohol use: No  . Drug use: No     Allergies   Patient has no known allergies.   Review of Systems Review of Systems  Constitutional: Negative for chills and fever.  HENT: Positive for congestion, ear pain, postnasal drip and rhinorrhea. Negative for facial swelling, sinus pressure, sore throat and trouble swallowing.   Eyes: Negative for visual disturbance.  Respiratory: Positive for cough. Negative for shortness of breath and wheezing.   Cardiovascular: Negative  for chest pain.  Gastrointestinal: Negative for abdominal pain and vomiting.  Genitourinary: Negative for difficulty urinating.  Musculoskeletal: Negative for gait problem.  Skin: Negative for rash.  Neurological: Negative for syncope, light-headedness and headaches.     Physical Exam Updated Vital Signs BP (!) 172/110 (BP Location: Right Arm)   Pulse (!) 59   Temp 97.9 F (36.6 C) (Oral)   Resp 18   Ht 5\' 8"  (1.727 m)   Wt 106.6 kg   SpO2 100%   BMI 35.73 kg/m   Physical Exam    Constitutional: He appears well-developed and well-nourished. No distress.  Sitting at bedside in no apparent distress, non-toxic appearing.   HENT:  Head: Normocephalic and atraumatic.  Mouth/Throat: Oropharynx is clear and moist. No oropharyngeal exudate.  Bilateral ear canals normal. No mastoid tenderness. Left TM erythematous and bulging. Right TM normal. Bilateral nasal turbinates boggy with clear rhinorrhea present. No facial swelling. No tenderness over maxillary or frontal sinuses. Posterior oropharynx without erythema or tonsillar exudate. Mucous membranes moist.   Eyes: Pupils are equal, round, and reactive to light. Conjunctivae are normal. Right eye exhibits no discharge. Left eye exhibits no discharge.  Neck: Normal range of motion. Neck supple.  Cardiovascular: Normal rate, regular rhythm and intact distal pulses.  Murmur heard. Pulmonary/Chest: Effort normal and breath sounds normal. No stridor. No respiratory distress. He has no wheezes. He has no rales.  Abdominal: Soft. There is no tenderness.  Musculoskeletal: Normal range of motion.  Lymphadenopathy:    He has no cervical adenopathy.  Neurological: He is alert. Coordination normal.  Skin: Skin is warm and dry. He is not diaphoretic.  Psychiatric: He has a normal mood and affect. His behavior is normal.  Nursing note and vitals reviewed.    ED Treatments / Results  Labs (all labs ordered are listed, but only abnormal results are displayed) Labs Reviewed - No data to display  EKG None  Radiology No results found.  Procedures Procedures (including critical care time)  Medications Ordered in ED Medications - No data to display   Initial Impression / Assessment and Plan / ED Course  I have reviewed the triage vital signs and the nursing notes.  Pertinent labs & imaging results that were available during my care of the patient were reviewed by me and considered in my medical decision making (see chart for  details).     Patient with exam that is constistant with otitis media. No concern for acute mastoiditis or meningitis. Patient discharged home with Augmentin. In terms of his post nasal drip and rhinorrhea, I have counseled him on using Zyrtec and Flonase. He is hypertensive in the ED, of note he is not taking his prescribed antihypertensive.  No chest pain, shortness of breath, headache, visual disturbance or changes in urination and I do not suspect hypertensive emergency.  According to chart review he takes carvedilol, I do not feel comfortable prescribing this given he is mildly bradycardic with pulse 59bpm in the ED. Will start amlodipine instead.  Advised him to establish care with PCP for recheck and further blood pressure management. I have also discussed reasons to return immediately to the ER.  Patient expresses understanding and agrees with plan.  Final Clinical Impressions(s) / ED Diagnoses   Final diagnoses:  Acute otitis media, unspecified otitis media type  Hypertension, unspecified type    ED Discharge Orders    None       Kellie Shropshire, PA-C 05/03/18 2222  Gwyneth SproutPlunkett, Whitney, MD 05/05/18 [id number]

## 2018-05-03 NOTE — Discharge Instructions (Addendum)
Take antibiotic twice a day for the next 10 days until antibiotic is completely gone.   You can take Zyrtec 10 mg daily to help with your runny nose.  I would advise you to stop taking Afrin.  Please start taking blood pressure medicine daily and establish care with a regular doctor.  You can use the phone number listed at the back of this packet to establish care with a regular doctor.  Return to the ER if you have any new or concerning symptoms like chest pain, trouble breathing, worsening of your vision, headaches.

## 2018-05-03 NOTE — ED Notes (Signed)
Pt unable to report blood pressure medication- reports he takes it every so many days.

## 2018-05-03 NOTE — ED Notes (Signed)
ED Provider at bedside. 

## 2018-05-03 NOTE — ED Notes (Signed)
Pt given rx x 2 at discharge for norvasc and augmentin

## 2018-05-03 NOTE — ED Triage Notes (Signed)
Pt reports nasal congestion, left ear pain, non-productive cough x 3 days

## 2018-05-20 ENCOUNTER — Emergency Department (HOSPITAL_BASED_OUTPATIENT_CLINIC_OR_DEPARTMENT_OTHER): Payer: Self-pay

## 2018-05-20 ENCOUNTER — Emergency Department (HOSPITAL_BASED_OUTPATIENT_CLINIC_OR_DEPARTMENT_OTHER)
Admission: EM | Admit: 2018-05-20 | Discharge: 2018-05-21 | Disposition: A | Discharge status: Home / Self Care | Payer: Self-pay | Attending: Emergency Medicine | Admitting: Emergency Medicine

## 2018-05-20 ENCOUNTER — Encounter (HOSPITAL_BASED_OUTPATIENT_CLINIC_OR_DEPARTMENT_OTHER): Payer: Self-pay

## 2018-05-20 ENCOUNTER — Other Ambulatory Visit: Payer: Self-pay

## 2018-05-20 DIAGNOSIS — R5383 Other fatigue: Secondary | ICD-10-CM | POA: Insufficient documentation

## 2018-05-20 DIAGNOSIS — I10 Essential (primary) hypertension: Secondary | ICD-10-CM | POA: Insufficient documentation

## 2018-05-20 DIAGNOSIS — I2510 Atherosclerotic heart disease of native coronary artery without angina pectoris: Secondary | ICD-10-CM | POA: Insufficient documentation

## 2018-05-20 DIAGNOSIS — R079 Chest pain, unspecified: Secondary | ICD-10-CM | POA: Insufficient documentation

## 2018-05-20 DIAGNOSIS — Z7982 Long term (current) use of aspirin: Secondary | ICD-10-CM | POA: Insufficient documentation

## 2018-05-20 DIAGNOSIS — Z79899 Other long term (current) drug therapy: Secondary | ICD-10-CM | POA: Insufficient documentation

## 2018-05-20 LAB — COMPREHENSIVE METABOLIC PANEL
ALK PHOS: 77 U/L (ref 38–126)
ALT: 36 U/L (ref 0–44)
AST: 28 U/L (ref 15–41)
Albumin: 4 g/dL (ref 3.5–5.0)
Anion gap: 10 (ref 5–15)
BUN: 18 mg/dL (ref 6–20)
CHLORIDE: 100 mmol/L (ref 98–111)
CO2: 29 mmol/L (ref 22–32)
CREATININE: 1.17 mg/dL (ref 0.61–1.24)
Calcium: 9 mg/dL (ref 8.9–10.3)
GFR calc Af Amer: >60 mL/min (ref >60)
Glucose, Bld: 94 mg/dL (ref 70–99)
Potassium: 3.9 mmol/L (ref 3.5–5.1)
Sodium: 139 mmol/L (ref 135–145)
Total Bilirubin: 0.4 mg/dL (ref 0.3–1.2)
Total Protein: 7.6 g/dL (ref 6.5–8.1)

## 2018-05-20 LAB — CBC
HEMATOCRIT: 42.5 % (ref 39.0–52.0)
HEMOGLOBIN: 14.4 g/dL (ref 13.0–17.0)
MCH: 29.4 pg (ref 26.0–34.0)
MCHC: 33.9 g/dL (ref 30.0–36.0)
MCV: 86.9 fL (ref 78.0–100.0)
Platelets: 287 10*3/uL (ref 150–400)
RBC: 4.89 MIL/uL (ref 4.22–5.81)
RDW: 13 % (ref 11.5–15.5)
WBC: 10.8 10*3/uL — ABNORMAL HIGH (ref 4.0–10.5)

## 2018-05-20 LAB — TROPONIN I: Troponin I: <0.03 ng/mL (ref <0.03)

## 2018-05-20 NOTE — ED Triage Notes (Signed)
Pt c/o palpitations for the last several months, since last night has been having them more frequently with chest pressure with rolling over and moving, recently put on BP medication

## 2018-05-21 MED ORDER — AMLODIPINE BESYLATE 10 MG PO TABS: 10.0000 mg | ORAL_TABLET | Freq: Every day | ORAL | 0 refills | Status: DC

## 2018-05-21 NOTE — ED Provider Notes (Signed)
[facility] EMERGENCY DEPARTMENT Provider Note   CSN: [id number] Arrival date & time: 05/20/18  2158     History   Chief Complaint Chief Complaint  Patient presents with  . Palpitations    HPI Donald Cole is a 57 y.o. male.  The history is provided by the patient.  He has history of hypertension, hyperlipidemia, coronary artery disease who comes in stating that for the past several weeks, he is felt generally fatigued.  He is has had spells where it seems like he is drifting to sleep and is suddenly jolted awake.  He has been checking his blood pressure, and it has been elevated.  He was in the emergency department last month and was given a prescription for amlodipine 5 mg, but it is he states it does not seem like it is being effective.  He denies headache.  He had one episode of chest pain yesterday but has not had any today.  Of note, his primary care provider had retired and he is searching for a new primary care provider.  Past Medical History:  Diagnosis Date  . Coronary artery disease   . Hypercholesterolemia   . Hypertension     There are no active problems to display for this patient.   History reviewed. No pertinent surgical history.      Home Medications    Prior to Admission medications   Medication Sig Start Date End Date Taking? Authorizing Provider  albuterol (PROVENTIL HFA;VENTOLIN HFA) 108 (90 Base) MCG/ACT inhaler Inhale 2 puffs into the lungs every 4 (four) hours as needed for wheezing or shortness of breath. 04/16/16   Hayden Rasmussen, NP  amLODipine (NORVASC) 5 MG tablet Take 5 mg by mouth daily.    [provider]  amLODipine (NORVASC) 5 MG tablet Take 1 tablet (5 mg total) by mouth daily. 05/03/18   Kellie Shropshire, PA-C  aspirin EC 81 MG tablet Take 81 mg by mouth daily.    [provider]  atorvastatin (LIPITOR) 80 MG tablet Take 80 mg by mouth daily.    [provider]  cetirizine (ZYRTEC) 10 MG tablet  Take 1 tablet (10 mg total) by mouth daily. 12/18/14   Palumbo, April, MD  fluticasone (FLONASE) 50 MCG/ACT nasal spray Place 2 sprays into both nostrils daily. 12/18/14   Palumbo, April, MD  gabapentin (NEURONTIN) 300 MG capsule Take 1 capsule (300 mg total) by mouth 3 (three) times daily. 02/06/17   Bethel Born, PA-C  ibuprofen (ADVIL,MOTRIN) 800 MG tablet Take 1 tablet (800 mg total) by mouth 3 (three) times daily. 12/18/14   Palumbo, April, MD  methylPREDNISolone (MEDROL DOSEPAK) 4 MG TBPK tablet Take as directed on packet 08/14/16   Horton, Mayer Masker, MD    Family History No family history on file.  Social History Social History   Tobacco Use  . Smoking status: Never Smoker  . Smokeless tobacco: Never Used  Substance Use Topics  . Alcohol use: No  . Drug use: No     Allergies   Patient has no known allergies.   Review of Systems Review of Systems  All other systems reviewed and are negative.    Physical Exam Updated Vital Signs BP (!) 173/95 (BP Location: Right Arm)   Pulse 76   Temp 98.9 F (37.2 C) (Oral)   Resp 18   Ht 5\' 8"  (1.727 m)   Wt 104.3 kg   SpO2 98%   BMI 34.97 kg/m   Physical  Exam  Nursing note and vitals reviewed.  57 year old male, resting comfortably and in no acute distress. Vital signs are significant for elevated blood pressure. Oxygen saturation is 98%, which is normal. Head is normocephalic and atraumatic. PERRLA, EOMI. Oropharynx is clear. Neck is nontender and supple without adenopathy or JVD. Back is nontender and there is no CVA tenderness. Lungs are clear without rales, wheezes, or rhonchi. Chest is nontender. Heart has regular rate and rhythm without murmur. Abdomen is soft, flat, nontender without masses or hepatosplenomegaly and peristalsis is normoactive. Extremities have no cyanosis or edema, full range of motion is present. Skin is warm and dry without rash. Neurologic: Mental status is normal, cranial nerves are intact,  there are no motor or sensory deficits.  ED Treatments / Results  Labs (all labs ordered are listed, but only abnormal results are displayed) Labs Reviewed  CBC - Abnormal; Notable for the following components:      Result Value   WBC 10.8 (*)    All other components within normal limits  TROPONIN I  COMPREHENSIVE METABOLIC PANEL    EKG EKG Interpretation  Date/Time:  Tuesday May 20 2018 22:05:25 EDT Ventricular Rate:  64 PR Interval:  200 QRS Duration: 108 QT Interval:  370 QTC Calculation: 381 R Axis:   9 Text Interpretation:  Normal sinus rhythm Possible Anterior infarct , age undetermined Abnormal ECG When compared with ECG of 08/04/2013, No significant change was found Confirmed by Dione Booze ([id number]) on 05/21/2018 1:13:13 AM   Radiology Dg Chest 2 View  Result Date: 05/20/2018 CLINICAL DATA:  Initial evaluation for chest tightness, palpitations. EXAM: CHEST - 2 VIEW COMPARISON:  Prior radiograph from 11/03/2015. FINDINGS: Mild cardiomegaly, stable.  Mediastinal silhouette normal. Lungs hypoinflated. No focal infiltrates. No pulmonary edema or pleural effusion. No pneumothorax. No acute osseous abnormality. IMPRESSION: 1. Shallow lung inflation with secondary mild bibasilar subsegmental atelectatic changes. 2. No other active cardiopulmonary disease. Electronically Signed   By: Rise Mu M.D.   On: 05/20/2018 22:55    Procedures Procedures   Medications Ordered in ED Medications - No data to display   Initial Impression / Assessment and Plan / ED Course  I have reviewed the triage vital signs and the nursing notes.  Pertinent labs & imaging results that were available during my care of the patient were reviewed by me and considered in my medical decision making (see chart for details).  Multiple complaints.  Patient does need a thorough evaluation beyond what can be done in the ED.  I am wondering if he is describing symptoms of narcolepsy.  Single  episode of chest pain yesterday not felt to be cardiac.  Troponin is normal and with episode having occurred more than 24 hours ago, single troponin is sufficient.  His ECG shows no acute changes.  Chest x-ray shows no acute changes.  He states that he is borderline diabetic, blood glucose is normal at 94  and normal electrolytes..  Normal hepatic and renal function.  He is advised of these lab findings.  We will increase his amlodipine to 10 mg daily.  Encouraged to find new primary care provider.  Return precautions discussed.  Final Clinical Impressions(s) / ED Diagnoses   Final diagnoses:  Essential hypertension    ED Discharge Orders         Ordered    amLODipine (NORVASC) 10 MG tablet  Daily     05/21/18 0129  Dione Booze, MD 05/21/18 [phone]

## 2018-05-21 NOTE — ED Notes (Signed)
Pt verbalizes understanding of d/c instructions and denies any further needs at this time. 

## 2018-05-21 NOTE — Discharge Instructions (Signed)
Please monitor your blood pressure at home.  Take it once a day and keep a record of it.  Take that record with you when you see your new primary care provider.

## 2018-11-05 ENCOUNTER — Encounter (HOSPITAL_BASED_OUTPATIENT_CLINIC_OR_DEPARTMENT_OTHER): Payer: Self-pay | Admitting: Emergency Medicine

## 2018-11-05 ENCOUNTER — Other Ambulatory Visit: Payer: Self-pay

## 2018-11-05 ENCOUNTER — Emergency Department (HOSPITAL_BASED_OUTPATIENT_CLINIC_OR_DEPARTMENT_OTHER): Payer: Self-pay

## 2018-11-05 ENCOUNTER — Emergency Department (HOSPITAL_BASED_OUTPATIENT_CLINIC_OR_DEPARTMENT_OTHER)
Admission: EM | Admit: 2018-11-05 | Discharge: 2018-11-05 | Disposition: A | Discharge status: Home / Self Care | Payer: Self-pay | Attending: Emergency Medicine | Admitting: Emergency Medicine

## 2018-11-05 DIAGNOSIS — I10 Essential (primary) hypertension: Secondary | ICD-10-CM | POA: Insufficient documentation

## 2018-11-05 DIAGNOSIS — I2510 Atherosclerotic heart disease of native coronary artery without angina pectoris: Secondary | ICD-10-CM | POA: Insufficient documentation

## 2018-11-05 DIAGNOSIS — Z7982 Long term (current) use of aspirin: Secondary | ICD-10-CM | POA: Insufficient documentation

## 2018-11-05 DIAGNOSIS — S63641A Sprain of metacarpophalangeal joint of right thumb, initial encounter: Secondary | ICD-10-CM | POA: Insufficient documentation

## 2018-11-05 DIAGNOSIS — W1830XA Fall on same level, unspecified, initial encounter: Secondary | ICD-10-CM | POA: Insufficient documentation

## 2018-11-05 DIAGNOSIS — Z79899 Other long term (current) drug therapy: Secondary | ICD-10-CM | POA: Insufficient documentation

## 2018-11-05 DIAGNOSIS — Y9222 Religious institution as the place of occurrence of the external cause: Secondary | ICD-10-CM | POA: Insufficient documentation

## 2018-11-05 DIAGNOSIS — Y999 Unspecified external cause status: Secondary | ICD-10-CM | POA: Insufficient documentation

## 2018-11-05 DIAGNOSIS — Y9389 Activity, other specified: Secondary | ICD-10-CM | POA: Insufficient documentation

## 2018-11-05 MED ORDER — IBUPROFEN 800 MG PO TABS: 800.0000 mg | ORAL_TABLET | Freq: Four times a day (QID) | ORAL | 0 refills | Status: DC | PRN

## 2018-11-05 NOTE — ED Provider Notes (Signed)
[facility] EMERGENCY DEPARTMENT Provider Note   CSN: [id number] Arrival date & time: 11/05/18  0101    History   Chief Complaint Chief Complaint  Patient presents with  . Finger Injury    HPI Donald Cole is a 58 y.o. male.     Patient presents to the emergency department for evaluation of thumb injury.  Injury occurred approximately 4 hours ago.  He reports that he was at [place] and another person was falling and he tried to catch them.  He grabbed onto their jacket and ended up falling to the floor.  He reports that he hit his hand on the ground and felt his thumb folded under him.  He is complaining of pain at the base of the thumb ever since.  No other injury.     Past Medical History:  Diagnosis Date  . Coronary artery disease   . Hypercholesterolemia   . Hypertension     There are no active problems to display for this patient.   History reviewed. No pertinent surgical history.      Home Medications    Prior to Admission medications   Medication Sig Start Date End Date Taking? Authorizing Provider  albuterol (PROVENTIL HFA;VENTOLIN HFA) 108 (90 Base) MCG/ACT inhaler Inhale 2 puffs into the lungs every 4 (four) hours as needed for wheezing or shortness of breath. 04/16/16   Hayden Rasmussen, NP  amLODipine (NORVASC) 10 MG tablet Take 1 tablet (10 mg total) by mouth daily. 05/21/18   Dione Booze, MD  aspirin EC 81 MG tablet Take 81 mg by mouth daily.    [provider]  cetirizine (ZYRTEC) 10 MG tablet Take 1 tablet (10 mg total) by mouth daily. 12/18/14   Palumbo, April, MD  fluticasone (FLONASE) 50 MCG/ACT nasal spray Place 2 sprays into both nostrils daily. 12/18/14   Palumbo, April, MD  gabapentin (NEURONTIN) 300 MG capsule Take 1 capsule (300 mg total) by mouth 3 (three) times daily. 02/06/17   Bethel Born, PA-C  ibuprofen (ADVIL,MOTRIN) 800 MG tablet Take 1 tablet (800 mg total) by mouth 3 (three) times daily. 12/18/14   Palumbo, April,  MD    Family History No family history on file.  Social History Social History   Tobacco Use  . Smoking status: Never Smoker  . Smokeless tobacco: Never Used  Substance Use Topics  . Alcohol use: No  . Drug use: No     Allergies   Patient has no known allergies.   Review of Systems Review of Systems  Musculoskeletal: Positive for arthralgias.  Neurological: Negative.      Physical Exam Updated Vital Signs BP (!) 183/101 (BP Location: Left Arm)   Pulse 75   Temp 98.6 F (37 C) (Oral)   Resp 18   Ht 5\' 8"  (1.727 m)   Wt 106.6 kg   SpO2 97%   BMI 35.73 kg/m   Physical Exam Constitutional:      Appearance: Normal appearance.  Musculoskeletal:     Right wrist: Normal. He exhibits normal range of motion, no tenderness, no bony tenderness, no swelling and no deformity.     Right hand: He exhibits tenderness (MCP joint thumb). He exhibits normal range of motion, normal capillary refill, no deformity and no laceration. Normal sensation noted.     Comments: Patient with tenderness at the ulnar aspect of the MCP joint of the right thumb.  There is significant pain with stress of the ulnar collateral ligament of the  thumb and does appear to be some laxity  Skin:    General: Skin is warm and dry.     Capillary Refill: Capillary refill takes less than 2 seconds.     Findings: No bruising.  Neurological:     Mental Status: He is alert.     Sensory: Sensation is intact.     Motor: Motor function is intact.      ED Treatments / Results  Labs (all labs ordered are listed, but only abnormal results are displayed) Labs Reviewed - No data to display  EKG None  Radiology No results found.  Procedures Procedures (including critical care time)  Medications Ordered in ED Medications - No data to display   Initial Impression / Assessment and Plan / ED Course  I have reviewed the triage vital signs and the nursing notes.  Pertinent labs & imaging results that  were available during my care of the patient were reviewed by me and considered in my medical decision making (see chart for details).        Presents with pain at the base of the thumb after falling.  Patient has tenderness at the ulnar aspect of the MCP joint and also has some laxity of the ulnar collateral ligament compared to the other thumb.  Patient placed in splint, will follow up with hand surgery.  Final Clinical Impressions(s) / ED Diagnoses   Final diagnoses:  Sprain of ulnar collateral ligament of metacarpophalangeal (MCP) joint of right thumb, initial encounter    ED Discharge Orders    None       Brittany Amirault, Canary Brim, MD 11/05/18 0126

## 2018-11-05 NOTE — ED Triage Notes (Signed)
Pt sustained injury to right thumb while trying to catch a falling person.

## 2018-11-11 ENCOUNTER — Other Ambulatory Visit: Payer: Self-pay | Admitting: Orthopedic Surgery

## 2018-11-11 DIAGNOSIS — S5331XA Traumatic rupture of right ulnar collateral ligament, initial encounter: Secondary | ICD-10-CM | POA: Insufficient documentation

## 2018-11-11 DIAGNOSIS — S63641A Sprain of metacarpophalangeal joint of right thumb, initial encounter: Secondary | ICD-10-CM | POA: Insufficient documentation

## 2018-11-12 ENCOUNTER — Other Ambulatory Visit: Payer: Self-pay

## 2018-11-12 ENCOUNTER — Encounter (HOSPITAL_BASED_OUTPATIENT_CLINIC_OR_DEPARTMENT_OTHER): Payer: Self-pay | Admitting: *Deleted

## 2018-11-12 NOTE — Progress Notes (Signed)
   11/12/18 0945  OBSTRUCTIVE SLEEP APNEA  Have you ever been diagnosed with sleep apnea through a sleep study? No  Do you snore loudly (loud enough to be heard through closed doors)?  1  Do you often feel tired, fatigued, or sleepy during the daytime (such as falling asleep during driving or talking to someone)? 0  Has anyone observed you stop breathing during your sleep? 0  Do you have, or are you being treated for high blood pressure? 1  BMI more than 35 kg/m2? 1  Age > 50 (1-yes) 1  Neck circumference greater than:Male 16 inches or larger, Male 17inches or larger? 0  Male Gender (Yes=1) 1  Obstructive Sleep Apnea Score 5  Score 5 or greater  Results sent to PCP

## 2018-11-13 ENCOUNTER — Other Ambulatory Visit: Payer: Self-pay | Admitting: Orthopedic Surgery

## 2018-11-13 NOTE — Progress Notes (Signed)
Spoke with Ruford Npo after midnight, arrive 1000 11-14-18 [facility] meds to take sip of water: amlodipine Records on chart/epic; ekg 05-20-18 Needs I stat 4 Has surgery orders in epic Driver godson daniel mcneil

## 2018-11-14 ENCOUNTER — Encounter (HOSPITAL_BASED_OUTPATIENT_CLINIC_OR_DEPARTMENT_OTHER): Payer: Self-pay | Admitting: *Deleted

## 2018-11-14 ENCOUNTER — Ambulatory Visit (HOSPITAL_BASED_OUTPATIENT_CLINIC_OR_DEPARTMENT_OTHER)
Admission: RE | Admit: 2018-11-14 | Discharge: 2018-11-14 | Disposition: A | Discharge status: Home / Self Care | Payer: Self-pay | Attending: Orthopedic Surgery | Admitting: Orthopedic Surgery

## 2018-11-14 ENCOUNTER — Ambulatory Visit (HOSPITAL_BASED_OUTPATIENT_CLINIC_OR_DEPARTMENT_OTHER): Payer: Self-pay | Admitting: Anesthesiology

## 2018-11-14 ENCOUNTER — Other Ambulatory Visit: Payer: Self-pay

## 2018-11-14 ENCOUNTER — Encounter (HOSPITAL_BASED_OUTPATIENT_CLINIC_OR_DEPARTMENT_OTHER)
Admission: RE | Disposition: A | Discharge status: Home / Self Care | Payer: Self-pay | Source: Home / Self Care | Attending: Orthopedic Surgery

## 2018-11-14 DIAGNOSIS — W19XXXA Unspecified fall, initial encounter: Secondary | ICD-10-CM | POA: Insufficient documentation

## 2018-11-14 DIAGNOSIS — I10 Essential (primary) hypertension: Secondary | ICD-10-CM | POA: Insufficient documentation

## 2018-11-14 DIAGNOSIS — S63418A Traumatic rupture of collateral ligament of other finger at metacarpophalangeal and interphalangeal joint, initial encounter: Secondary | ICD-10-CM | POA: Insufficient documentation

## 2018-11-14 DIAGNOSIS — R7303 Prediabetes: Secondary | ICD-10-CM | POA: Insufficient documentation

## 2018-11-14 DIAGNOSIS — E7800 Pure hypercholesterolemia, unspecified: Secondary | ICD-10-CM | POA: Insufficient documentation

## 2018-11-14 DIAGNOSIS — Z79899 Other long term (current) drug therapy: Secondary | ICD-10-CM | POA: Insufficient documentation

## 2018-11-14 HISTORY — DX: Sprain of metacarpophalangeal joint of right thumb, initial encounter: S63.641A

## 2018-11-14 HISTORY — DX: Prediabetes: R73.03

## 2018-11-14 HISTORY — PX: LIGAMENT REPAIR: SHX5444

## 2018-11-14 LAB — POCT I-STAT 4, (NA,K, GLUC, HGB,HCT)
Glucose, Bld: 151 mg/dL — ABNORMAL HIGH (ref 70–99)
HCT: 45 % (ref 39.0–52.0)
Hemoglobin: 15.3 g/dL (ref 13.0–17.0)
Potassium: 4 mmol/L (ref 3.5–5.1)
Sodium: 138 mmol/L (ref 135–145)

## 2018-11-14 SURGERY — REPAIR, LIGAMENT
Anesthesia: General | Site: Thumb | Laterality: Right

## 2018-11-14 MED ORDER — ONDANSETRON HCL 4 MG/2ML IJ SOLN
INTRAMUSCULAR | Status: DC | PRN
  Administered 2018-11-14: 4 mg via INTRAVENOUS

## 2018-11-14 MED ORDER — LIDOCAINE 2% (20 MG/ML) 5 ML SYRINGE
INTRAMUSCULAR | Status: AC
  Filled 2018-11-14: qty 5

## 2018-11-14 MED ORDER — MIDAZOLAM HCL 2 MG/2ML IJ SOLN
INTRAMUSCULAR | Status: AC
  Filled 2018-11-14: qty 2

## 2018-11-14 MED ORDER — PROPOFOL 10 MG/ML IV BOLUS
INTRAVENOUS | Status: AC
  Filled 2018-11-14: qty 20

## 2018-11-14 MED ORDER — OXYCODONE-ACETAMINOPHEN 5-325 MG PO TABS: 1.0000 | ORAL_TABLET | ORAL | 0 refills | Status: DC | PRN

## 2018-11-14 MED ORDER — FENTANYL CITRATE (PF) 100 MCG/2ML IJ SOLN
INTRAMUSCULAR | Status: DC | PRN
  Administered 2018-11-14 (×2): 25 ug via INTRAVENOUS
  Administered 2018-11-14: 50 ug via INTRAVENOUS

## 2018-11-14 MED ORDER — MIDAZOLAM HCL 2 MG/2ML IJ SOLN
INTRAMUSCULAR | Status: DC | PRN
  Administered 2018-11-14: 2 mg via INTRAVENOUS

## 2018-11-14 MED ORDER — DEXAMETHASONE SODIUM PHOSPHATE 10 MG/ML IJ SOLN
INTRAMUSCULAR | Status: DC | PRN
  Administered 2018-11-14: 4 mg via INTRAVENOUS

## 2018-11-14 MED ORDER — CEFAZOLIN SODIUM-DEXTROSE 2-4 GM/100ML-% IV SOLN
2.0000 g | INTRAVENOUS | Status: AC
  Administered 2018-11-14: 2 g via INTRAVENOUS
  Filled 2018-11-14: qty 100

## 2018-11-14 MED ORDER — FENTANYL CITRATE (PF) 100 MCG/2ML IJ SOLN
25.0000 ug | INTRAMUSCULAR | Status: DC | PRN
  Filled 2018-11-14: qty 1

## 2018-11-14 MED ORDER — LIDOCAINE 2% (20 MG/ML) 5 ML SYRINGE
INTRAMUSCULAR | Status: DC | PRN
  Administered 2018-11-14: 80 mg via INTRAVENOUS

## 2018-11-14 MED ORDER — CEFAZOLIN SODIUM-DEXTROSE 2-4 GM/100ML-% IV SOLN
INTRAVENOUS | Status: AC
  Filled 2018-11-14: qty 100

## 2018-11-14 MED ORDER — FENTANYL CITRATE (PF) 100 MCG/2ML IJ SOLN
INTRAMUSCULAR | Status: AC
  Filled 2018-11-14: qty 2

## 2018-11-14 MED ORDER — OXYCODONE HCL 5 MG/5ML PO SOLN
5.0000 mg | Freq: Once | ORAL | Status: AC | PRN
  Filled 2018-11-14: qty 5

## 2018-11-14 MED ORDER — OXYCODONE HCL 5 MG PO TABS
ORAL_TABLET | ORAL | Status: AC
  Filled 2018-11-14: qty 1

## 2018-11-14 MED ORDER — DEXAMETHASONE SODIUM PHOSPHATE 10 MG/ML IJ SOLN
INTRAMUSCULAR | Status: AC
  Filled 2018-11-14: qty 1

## 2018-11-14 MED ORDER — CHLORHEXIDINE GLUCONATE 4 % EX LIQD
60.0000 mL | Freq: Once | CUTANEOUS | Status: DC
  Filled 2018-11-14: qty 118

## 2018-11-14 MED ORDER — BUPIVACAINE HCL 0.25 % IJ SOLN
INTRAMUSCULAR | Status: DC | PRN
  Administered 2018-11-14: 9 mL

## 2018-11-14 MED ORDER — OXYCODONE HCL 5 MG PO TABS
5.0000 mg | ORAL_TABLET | Freq: Once | ORAL | Status: AC | PRN
  Administered 2018-11-14: 5 mg via ORAL
  Filled 2018-11-14: qty 1

## 2018-11-14 MED ORDER — ONDANSETRON HCL 4 MG/2ML IJ SOLN
4.0000 mg | Freq: Once | INTRAMUSCULAR | Status: DC | PRN
  Filled 2018-11-14: qty 2

## 2018-11-14 MED ORDER — PROPOFOL 10 MG/ML IV BOLUS
INTRAVENOUS | Status: DC | PRN
  Administered 2018-11-14: 180 mg via INTRAVENOUS

## 2018-11-14 MED ORDER — LACTATED RINGERS IV SOLN
INTRAVENOUS | Status: DC
  Administered 2018-11-14: 11:00:00 via INTRAVENOUS
  Filled 2018-11-14: qty 1000

## 2018-11-14 MED ORDER — ONDANSETRON HCL 4 MG/2ML IJ SOLN
INTRAMUSCULAR | Status: AC
  Filled 2018-11-14: qty 2

## 2018-11-14 SURGICAL SUPPLY — 72 items
ANCH SUT 2-0 3/8 CRC MIC FT 4 (Anchor) ×1 IMPLANT
ANCHOR FT CORKSCREW MICRO 2-0 (Anchor) ×2 IMPLANT
APL SKNCLS STERI-STRIP NONHPOA (GAUZE/BANDAGES/DRESSINGS) ×1
BANDAGE ACE 3X5.8 VEL STRL LF (GAUZE/BANDAGES/DRESSINGS) ×3 IMPLANT
BANDAGE ACE 4X5 VEL STRL LF (GAUZE/BANDAGES/DRESSINGS) IMPLANT
BENZOIN TINCTURE PRP APPL 2/3 (GAUZE/BANDAGES/DRESSINGS) ×2 IMPLANT
BLADE MINI RND TIP GREEN BEAV (BLADE) IMPLANT
BLADE SURG 10 STRL SS (BLADE) IMPLANT
BLADE SURG 15 STRL LF DISP TIS (BLADE) ×1 IMPLANT
BLADE SURG 15 STRL SS (BLADE) ×3
BNDG CMPR 9X4 STRL LF SNTH (GAUZE/BANDAGES/DRESSINGS) ×1
BNDG ESMARK 4X9 LF (GAUZE/BANDAGES/DRESSINGS) ×3 IMPLANT
BNDG GAUZE ELAST 4 BULKY (GAUZE/BANDAGES/DRESSINGS) ×3 IMPLANT
BNDG PLASTER X FAST 5X5 WHT LF (CAST SUPPLIES) IMPLANT
BNDG PLSTR 5X5 XFST ST WHT LF (CAST SUPPLIES)
CANISTER SUCT 1200ML W/VALVE (MISCELLANEOUS) IMPLANT
CLOSURE WOUND 1/2 X4 (GAUZE/BANDAGES/DRESSINGS) ×1
CORD BIPOLAR FORCEPS 12FT (ELECTRODE) ×3 IMPLANT
COVER BACK TABLE 60X90IN (DRAPES) ×3 IMPLANT
COVER WAND RF STERILE (DRAPES) IMPLANT
CUFF TOURN SGL LL 18 NRW (TOURNIQUET CUFF) ×3 IMPLANT
CUFF TOURN SGL QUICK 24 (TOURNIQUET CUFF) ×3
CUFF TRNQT CYL 24X4X16.5-23 (TOURNIQUET CUFF) IMPLANT
DECANTER SPIKE VIAL GLASS SM (MISCELLANEOUS) IMPLANT
DRAPE EXTREMITY T 121X128X90 (DISPOSABLE) ×3 IMPLANT
DRAPE SURG 17X23 STRL (DRAPES) ×3 IMPLANT
DRSG PAD ABDOMINAL 8X10 ST (GAUZE/BANDAGES/DRESSINGS) IMPLANT
DURAPREP 26ML APPLICATOR (WOUND CARE) ×3 IMPLANT
GAUZE 4X4 16PLY RFD (DISPOSABLE) IMPLANT
GAUZE SPONGE 4X4 12PLY STRL (GAUZE/BANDAGES/DRESSINGS) ×3 IMPLANT
GAUZE XEROFORM 1X8 LF (GAUZE/BANDAGES/DRESSINGS) ×2 IMPLANT
GLOVE BIOGEL PI IND STRL 7.0 (GLOVE) IMPLANT
GLOVE BIOGEL PI INDICATOR 7.0 (GLOVE) ×2
GLOVE ECLIPSE 7.0 STRL STRAW (GLOVE) ×2 IMPLANT
GLOVE SURG SYN 8.0 (GLOVE) ×6 IMPLANT
GLOVE SURG SYN 8.0 PF PI (GLOVE) ×2 IMPLANT
GOWN STRL REIN XL XLG (GOWN DISPOSABLE) ×3 IMPLANT
LOOP VESSEL MAXI BLUE (MISCELLANEOUS) IMPLANT
MASK DUCK BILL (MISCELLANEOUS) ×2
MASK SURG DUCK BILL (MISCELLANEOUS) ×1 IMPLANT
NDL HYPO 25X1 1.5 SAFETY (NEEDLE) ×1 IMPLANT
NEEDLE HYPO 25X1 1.5 SAFETY (NEEDLE) ×3 IMPLANT
NS IRRIG 1000ML POUR BTL (IV SOLUTION) ×3 IMPLANT
PACK BASIN DAY SURGERY FS (CUSTOM PROCEDURE TRAY) ×3 IMPLANT
PAD CAST 3X4 CTTN HI CHSV (CAST SUPPLIES) ×1 IMPLANT
PAD CAST 4YDX4 CTTN HI CHSV (CAST SUPPLIES) IMPLANT
PADDING CAST COTTON 3X4 STRL (CAST SUPPLIES) ×3
PADDING CAST COTTON 4X4 STRL (CAST SUPPLIES)
SHEET MEDIUM DRAPE 40X70 STRL (DRAPES) ×3 IMPLANT
SLING ARM FOAM STRAP LRG (SOFTGOODS) IMPLANT
SLING ARM MED ADULT FOAM STRAP (SOFTGOODS) IMPLANT
SPLINT PLASTER CAST XFAST 4X15 (CAST SUPPLIES) ×5 IMPLANT
SPLINT PLASTER XTRA FAST SET 4 (CAST SUPPLIES) ×2
STOCKINETTE 4X48 STRL (DRAPES) ×3 IMPLANT
STRIP CLOSURE SKIN 1/2X4 (GAUZE/BANDAGES/DRESSINGS) ×1 IMPLANT
SUCTION FRAZIER HANDLE 10FR (MISCELLANEOUS)
SUCTION TUBE FRAZIER 10FR DISP (MISCELLANEOUS) IMPLANT
SUT ETHIBOND 2 OS 4 DA (SUTURE) IMPLANT
SUT FIBERWIRE 2-0 18 17.9 3/8 (SUTURE)
SUT PROLENE 3 0 PS 2 (SUTURE) IMPLANT
SUT VIC AB 0 SH 27 (SUTURE) IMPLANT
SUT VIC AB 2-0 SH 27 (SUTURE)
SUT VIC AB 2-0 SH 27XBRD (SUTURE) IMPLANT
SUT VIC AB 3-0 X1 27 (SUTURE) IMPLANT
SUT VICRYL RAPIDE 4-0 (SUTURE) IMPLANT
SUT VICRYL RAPIDE 4/0 PS 2 (SUTURE) IMPLANT
SUTURE FIBERWR 2-0 18 17.9 3/8 (SUTURE) IMPLANT
SYR 10ML LL (SYRINGE) ×3 IMPLANT
SYR BULB 3OZ (MISCELLANEOUS) ×3 IMPLANT
TUBE CONNECTING 12'X1/4 (SUCTIONS)
TUBE CONNECTING 12X1/4 (SUCTIONS) IMPLANT
UNDERPAD 30X30 (UNDERPADS AND DIAPERS) ×3 IMPLANT

## 2018-11-14 NOTE — Anesthesia Preprocedure Evaluation (Signed)
Anesthesia Evaluation  Patient identified by MRN, date of birth, ID band Patient awake    Reviewed: Allergy & Precautions, NPO status , Patient's Chart, lab work & pertinent test results  History of Anesthesia Complications Negative for: history of anesthetic complications  Airway Mallampati: II  TM Distance: >3 FB Neck ROM: Full    Dental  (+) Partial Upper   Pulmonary neg pulmonary ROS,    Pulmonary exam normal        Cardiovascular hypertension, Normal cardiovascular exam     Neuro/Psych negative neurological ROS  negative psych ROS   GI/Hepatic negative GI ROS, Neg liver ROS,   Endo/Other  negative endocrine ROS  Renal/GU negative Renal ROS  negative genitourinary   Musculoskeletal negative musculoskeletal ROS (+)   Abdominal   Peds  Hematology negative hematology ROS (+)   Anesthesia Other Findings   Reproductive/Obstetrics                             Anesthesia Physical Anesthesia Plan  ASA: III  Anesthesia Plan: General   Post-op Pain Management:    Induction: Intravenous  PONV Risk Score and Plan: 2 and Ondansetron, Dexamethasone, Midazolam and Treatment may vary due to age or medical condition  Airway Management Planned: LMA  Additional Equipment: None  Intra-op Plan:   Post-operative Plan: Extubation in OR  Informed Consent: I have reviewed the patients History and Physical, chart, labs and discussed the procedure including the risks, benefits and alternatives for the proposed anesthesia with the patient or authorized representative who has indicated his/her understanding and acceptance.     Dental advisory given  Plan Discussed with:   Anesthesia Plan Comments:         Anesthesia Quick Evaluation

## 2018-11-14 NOTE — Anesthesia Procedure Notes (Signed)
Procedure Name: LMA Insertion Date/Time: 11/14/2018 11:07 AM Performed by: Francie Massing, CRNA Pre-anesthesia Checklist: Patient identified, Emergency Drugs available, Suction available and Patient being monitored Patient Re-evaluated:Patient Re-evaluated prior to induction Oxygen Delivery Method: Circle system utilized Preoxygenation: Pre-oxygenation with 100% oxygen Induction Type: IV induction Ventilation: Mask ventilation without difficulty LMA: LMA inserted LMA Size: 5.0 Number of attempts: 1 Airway Equipment and Method: Bite block Placement Confirmation: positive ETCO2 Tube secured with: Tape Dental Injury: Teeth and Oropharynx as per pre-operative assessment

## 2018-11-14 NOTE — Discharge Instructions (Signed)

## 2018-11-14 NOTE — Transfer of Care (Signed)
Immediate Anesthesia Transfer of Care Note  Patient: Donald Cole  Procedure(s) Performed: Procedure(s) (LRB): RIGHT THUMB ULNAR COLLATERAL LIGAMENT REPAIR (Right)  Patient Location: PACU  Anesthesia Type: General  Level of Consciousness: awake, oriented, sedated and patient cooperative  Airway & Oxygen Therapy: Patient Spontanous Breathing and Patient connected to face mask oxygen  Post-op Assessment: Report given to PACU RN and Post -op Vital signs reviewed and stable  Post vital signs: Reviewed and stable  Complications: No apparent anesthesia complications  Last Vitals:  Vitals Value Taken Time  BP 188/113 11/14/2018 11:53 AM  Temp 36.7 C 11/14/2018 11:53 AM  Pulse 73 11/14/2018 11:56 AM  Resp 20 11/14/2018 11:56 AM  SpO2 91 % 11/14/2018 11:56 AM  Vitals shown include unvalidated device data.  Last Pain:  Vitals:   11/14/18 1034  TempSrc:   PainSc: 0-No pain      Patients Stated Pain Goal: 6 (11/14/18 1034)

## 2018-11-14 NOTE — H&P (Signed)
Donald Cole is an 58 y.o. male.   Chief Complaint: Right thumb pain and instability HPI: Patient is a very pleasant 58 year old male status post fall onto an outstretched upper extremity on the right with immediate pain, swelling, and deformity to the right thumb metacarpal phalangeal joint consistent with collateral ligament rupture.  Past Medical History:  Diagnosis Date  . Hypercholesterolemia   . Hypertension    non compliant with amlodipine  . Pre-diabetes   . Rupture of UCL of right thumb     History reviewed. No pertinent surgical history.  History reviewed. No pertinent family history. Social History:  reports that he has never smoked. He has never used smokeless tobacco. He reports that he does not drink alcohol or use drugs.  Allergies: No Known Allergies  Medications Prior to Admission  Medication Sig Dispense Refill  . amLODipine (NORVASC) 10 MG tablet Take 1 tablet (10 mg total) by mouth daily. 30 tablet 0  . ibuprofen (ADVIL,MOTRIN) 800 MG tablet Take 1 tablet (800 mg total) by mouth every 6 (six) hours as needed for moderate pain. 20 tablet 0    No results found for this or any previous visit (from the past 48 hour(s)). No results found.  Review of Systems  All other systems reviewed and are negative.   Blood pressure (!) 157/99, pulse 69, temperature 98.4 F (36.9 C), temperature source Oral, resp. rate 16, height 5\' 8"  (1.727 m), weight 114.3 kg, SpO2 98 %. Physical Exam  Constitutional: He is oriented to person, place, and time. He appears well-developed and well-nourished.  HENT:  Head: Normocephalic and atraumatic.  Neck: Normal range of motion.  Cardiovascular: Normal rate.  Respiratory: Effort normal.  Musculoskeletal:     Right hand: He exhibits tenderness and swelling.     Comments: Right thumb pain, swelling, and instability on ulnar side of metacarpal phalangeal joint  Neurological: He is alert and oriented to person, place, and time.   Skin: Skin is warm.  Psychiatric: He has a normal mood and affect. His behavior is normal. Judgment and thought content normal.     Assessment/Plan Patient is a 58 year old male with an acute rupture of his right thumb ulnar collateral ligament at the metacarpophalangeal joint level.  Have discussed the role of primary repair of this ligament as an outpatient.  Patient understands risks and benefits and the 3 to 56-month recovery.  And wishes to proceed  Marlowe Shores, MD 11/14/2018, 10:39 AM

## 2018-11-14 NOTE — Op Note (Signed)
NAME: JEREN, SWETT MEDICAL RECORD [id number] ACCOUNT [id number] DATE OF BIRTH:Sep 10, 1961 FACILITY: [facility] LOCATION: [facility] PHYSICIAN:Cleatus Gabriel A. Mina Marble, MD  OPERATIVE REPORT  DATE OF PROCEDURE:  11/14/2018  PREOPERATIVE DIAGNOSIS:  Acute rupture right thumb ulnar collateral ligament.  POSTOPERATIVE DIAGNOSIS:  Acute rupture right thumb ulnar collateral ligament.  PROCEDURE:  Primary repair, right thumb ulnar collateral ligament with a 2.2 mm Micro Corkscrew anchor.  SURGEON:  Dairl Ponder, MD  ASSISTANT:  None.  ANESTHESIA:  General.  COMPLICATIONS:  No complications.  DRAINS:  No drains.  DESCRIPTION OF PROCEDURE:  The patient was taken to the operating suite after induction of adequate general anesthetic.  Right upper extremity was prepped and draped in the usual sterile fashion.  An Esmarch was used to exsanguinate the limb and the  tourniquet was inflated to 250 mmHg.  At this point in time, incision was made on the ulnar side of the metacarpophalangeal joint of the right thumb and a dorsally based flap was elevated.  The adductor aponeurosis, which was partially torn was elevated  palmarly and dorsally.  There was a complete rupture of the ulnar collateral ligament off the base of the proximal phalanx, which had folded back onto itself.  We carefully dissected this free after extending the small tear in the capsule proximally and  distally.  Once this was done, we placed a 2.2 mm Micro Corkscrew anchor into the volar ulnar base of the proximal phalanx directly dorsally and radially.  Once this was done using the FiberWire suture attached to the anchor, we repaired the collateral  ligament primarily back to its insertion point.  We also repaired the capsule using the same 2-0 FiberWire suture.  We irrigated and loosely closed in layers of 4-0 Vicryl to reapproximate the adductor aponeurosis and a 3-0 Prolene subcuticular stitch on  the skin.  Steri-Strips, 4 x 4  fluffs and a radial gutter splint was applied.  The patient tolerated this procedure well and went to recovery room in stable fashion.  TN/NUANCE  D:11/14/2018 T:11/14/2018 JOB:[id number]

## 2018-11-14 NOTE — Anesthesia Postprocedure Evaluation (Signed)
Anesthesia Post Note  Patient: [name]  Procedure(s) Performed: RIGHT THUMB ULNAR COLLATERAL LIGAMENT REPAIR (Right Thumb)     Patient location during evaluation: PACU Anesthesia Type: General Level of consciousness: awake and alert Pain management: pain level controlled Vital Signs Assessment: post-procedure vital signs reviewed and stable Respiratory status: spontaneous breathing, nonlabored ventilation and respiratory function stable Cardiovascular status: blood pressure returned to baseline and stable Postop Assessment: no apparent nausea or vomiting Anesthetic complications: no    Last Vitals:  Vitals:   11/14/18 1245 11/14/18 1300  BP: (!) 164/91 (!) 162/103  Pulse: (!) 56 72  Resp: 13 12  Temp:    SpO2: 97% 94%    Last Pain:  Vitals:   11/14/18 1245  TempSrc:   PainSc: 0-No pain                 Lucretia Kern

## 2018-11-14 NOTE — Op Note (Signed)
See dictated report #[id number]

## 2018-11-17 ENCOUNTER — Encounter (HOSPITAL_BASED_OUTPATIENT_CLINIC_OR_DEPARTMENT_OTHER): Payer: Self-pay | Admitting: Orthopedic Surgery

## 2019-02-08 ENCOUNTER — Encounter (HOSPITAL_COMMUNITY): Payer: Self-pay

## 2019-02-08 ENCOUNTER — Emergency Department (HOSPITAL_BASED_OUTPATIENT_CLINIC_OR_DEPARTMENT_OTHER): Payer: Self-pay

## 2019-02-08 ENCOUNTER — Emergency Department (HOSPITAL_COMMUNITY): Payer: Self-pay

## 2019-02-08 ENCOUNTER — Emergency Department (HOSPITAL_COMMUNITY)
Admission: EM | Admit: 2019-02-08 | Discharge: 2019-02-08 | Disposition: A | Discharge status: Home / Self Care | Payer: Self-pay | Attending: Emergency Medicine | Admitting: Emergency Medicine

## 2019-02-08 ENCOUNTER — Other Ambulatory Visit: Payer: Self-pay

## 2019-02-08 DIAGNOSIS — I10 Essential (primary) hypertension: Secondary | ICD-10-CM | POA: Insufficient documentation

## 2019-02-08 DIAGNOSIS — Z79899 Other long term (current) drug therapy: Secondary | ICD-10-CM | POA: Insufficient documentation

## 2019-02-08 DIAGNOSIS — M25561 Pain in right knee: Secondary | ICD-10-CM | POA: Insufficient documentation

## 2019-02-08 DIAGNOSIS — M79609 Pain in unspecified limb: Secondary | ICD-10-CM

## 2019-02-08 DIAGNOSIS — E119 Type 2 diabetes mellitus without complications: Secondary | ICD-10-CM | POA: Insufficient documentation

## 2019-02-08 HISTORY — DX: Type 2 diabetes mellitus without complications: E11.9

## 2019-02-08 MED ORDER — NAPROXEN 500 MG PO TABS: 500.0000 mg | ORAL_TABLET | Freq: Two times a day (BID) | ORAL | 0 refills | Status: DC

## 2019-02-08 NOTE — ED Notes (Signed)
Bed: [facility] Expected date:  Expected time:  Means of arrival:  Comments: 

## 2019-02-08 NOTE — Progress Notes (Signed)
Orthopedic Tech Progress Note Patient Details:  Donald Cole Sep 15, 1960 [id number] Pt has a knee sleeve in the room by the bed side. Ortho Devices Type of Ortho Device: Knee Sleeve Ortho Device/Splint Location: Rt knee       Tawni Carnes [facility] 02/08/2019, 6:23 PM

## 2019-02-08 NOTE — ED Triage Notes (Signed)
Patient c/o right knee pain x2 days. Patient states, "I hear a lot of popping when I move it." patient states it gets really stiff at night and I have trouble moving it.  Patient denies any injury

## 2019-02-08 NOTE — Progress Notes (Signed)
VASCULAR LAB PRELIMINARY  PRELIMINARY  PRELIMINARY  PRELIMINARY  Right lower extremity venous duplex completed.    Preliminary report:  See CV proc for preliminary results.  Gave Britni Henderly, PA-C results.  Maryem Shuffler, RVT 02/08/2019, 7:01 PM

## 2019-02-08 NOTE — Discharge Instructions (Addendum)
Evaluated today for knee pain.  Take prescription as prescribed.  Follow-up with orthopedics if your pain does not resolve.  If you notice redness, swelling, warmth to your knee please seek reevaluation.

## 2019-02-08 NOTE — ED Provider Notes (Signed)
[facility]-EMERGENCY DEPT Provider Note   CSN: [id number] Arrival date & time: 02/08/19  1510    History   Chief Complaint Chief Complaint  Patient presents with   Knee Pain    HPI Donald Cole is a 58 y.o. male with past medical history significant for DM, HTN who presents for evaluation of knee pain x 2 days. Patient states his knee is stiff at night. Pain is improved with movement during the day. Patient states that he feels "grinding" in his knee with flexion and extension. Denies prior history of OA. States he also has pain located to the popliteal fossa which radiates into the medical joint line. Denies hx of DVT or PE. No recent surgery, immobilization, malignancy. Denies fever, chills, N/V, decreased ROM in extremities, numbness/tingling, swelling, redness, warmth, hx of IVDU, recent injury or trauma, hx of gonorrhea, penile discharge, rashes, lesions. Rates his pain a 3/10. Denies aggravating or alleviating factors. Has taken Ibuprofen x1 for his pain yesterday evening. Ambulatory without difficulty.  History obtained from patient. No interpretor was used.     HPI  Past Medical History:  Diagnosis Date   Diabetes mellitus without complication (HCC)    Hypercholesterolemia    Hypertension    non compliant with amlodipine   Pre-diabetes    Rupture of UCL of right thumb     There are no active problems to display for this patient.   Past Surgical History:  Procedure Laterality Date   LIGAMENT REPAIR Right 11/14/2018   Procedure: RIGHT THUMB ULNAR COLLATERAL LIGAMENT REPAIR;  Surgeon: Dairl Ponder, MD;  Location: [facility] [place];  Service: Orthopedics;  Laterality: Right;        Home Medications    Prior to Admission medications   Medication Sig Start Date End Date Taking? Authorizing Provider  amLODipine (NORVASC) 10 MG tablet Take 1 tablet (10 mg total) by mouth daily. 05/21/18   Dione Booze, MD  ibuprofen  (ADVIL,MOTRIN) 800 MG tablet Take 1 tablet (800 mg total) by mouth every 6 (six) hours as needed for moderate pain. 11/05/18   Gilda Crease, MD  naproxen (NAPROSYN) 500 MG tablet Take 1 tablet (500 mg total) by mouth 2 (two) times daily. 02/08/19   Lynnette Pote A, PA-C  oxyCODONE-acetaminophen (PERCOCET) 5-325 MG tablet Take 1 tablet by mouth every 4 (four) hours as needed for severe pain. 11/14/18 11/14/19  Dairl Ponder, MD    Family History Family History  Problem Relation Age of Onset   Cancer Mother     Social History Social History   Tobacco Use   Smoking status: Never Smoker   Smokeless tobacco: Never Used  Substance Use Topics   Alcohol use: No   Drug use: No     Allergies   Patient has no known allergies.   Review of Systems Review of Systems  Constitutional: Negative.   HENT: Negative.   Respiratory: Negative.   Cardiovascular: Negative.   Gastrointestinal: Negative.   Genitourinary: Negative.   Musculoskeletal: Negative for arthralgias, gait problem, neck pain and neck stiffness.       Right knee pain  Skin: Negative.   Neurological: Negative.   All other systems reviewed and are negative.    Physical Exam Updated Vital Signs BP (!) 168/120 (BP Location: Right Arm)    Pulse 72    Temp 98.4 F (36.9 C) (Oral)    Resp 16    Ht 5\' 8"  (1.727 m)    Wt 104.3 kg  SpO2 100%    BMI 34.97 kg/m   Physical Exam Vitals signs and nursing note reviewed.  Constitutional:      General: He is not in acute distress.    Appearance: He is well-developed. He is not ill-appearing, toxic-appearing or diaphoretic.  HENT:     Head: Normocephalic and atraumatic.     Nose: Nose normal.     Mouth/Throat:     Mouth: Mucous membranes are moist.     Pharynx: Oropharynx is clear.  Eyes:     Pupils: Pupils are equal, round, and reactive to light.  Neck:     Musculoskeletal: Normal range of motion and neck supple. No neck rigidity or muscular tenderness.    Cardiovascular:     Rate and Rhythm: Normal rate and regular rhythm.     Pulses: Normal pulses.     Heart sounds: Normal heart sounds. No murmur. No friction rub. No gallop.   Pulmonary:     Effort: Pulmonary effort is normal. No respiratory distress.     Breath sounds: Normal breath sounds. No stridor. No wheezing, rhonchi or rales.  Abdominal:     General: Bowel sounds are normal. There is no distension.     Palpations: Abdomen is soft.     Tenderness: There is no abdominal tenderness. There is no guarding or rebound.  Musculoskeletal: Normal range of motion.     Left hip: Normal.     Right knee: He exhibits normal range of motion, no swelling, no effusion, no ecchymosis, no deformity, no laceration, no erythema, normal alignment, no LCL laxity, normal patellar mobility, no bony tenderness, normal meniscus and no MCL laxity. Tenderness found. Medial joint line tenderness noted. No lateral joint line, no MCL, no LCL and no patellar tendon tenderness noted.     Left knee: Normal.     Right ankle: Normal.     Left ankle: Normal.     Right upper leg: Normal.     Left upper leg: Normal.     Right lower leg: Normal.     Left lower leg: Normal.     Comments: Range of motion bilateral lower extremities with flexion, extension.  He has no crepitus.  No joint effusion to bilateral knees.  Negative varus, valgus stress, anterior drawer to bilateral knees.  He has 2+ DP, PT pulses bilaterally.  He does have some mild tenderness to his medial joint line to his right knee.  Also has some mild popliteal tenderness to the popliteal fossa.  No obvious deformity or injury.  He has no swelling or lower extremity edema.  Wiggles toes without difficulty. No tenderness to femur, tibia, fibula. Normal straight leg raise bilaterally.  Lymphadenopathy:     Cervical: No cervical adenopathy.  Skin:    General: Skin is warm and dry.     Comments: No edema, erythema, ecchymosis or warmth.  No induration or  fluctuance.  No rashes or lesions.  Brisk capillary refill.  Neurological:     Mental Status: He is alert.     Motor: No weakness.     Gait: Gait normal.     Comments: 5/5 strength bilateral lower extremities.  Ambulatory in ED without difficulty.      ED Treatments / Results  Labs (all labs ordered are listed, but only abnormal results are displayed) Labs Reviewed - No data to display  EKG None  Radiology Dg Knee Complete 4 Views Right  Result Date: 02/08/2019 CLINICAL DATA:  Right knee pain since this morning.  No reported injury. EXAM: RIGHT KNEE - COMPLETE 4+ VIEW COMPARISON:  10/01/2017 right knee radiographs FINDINGS: No fracture, significant joint effusion or dislocation. No suspicious focal osseous lesion. Stable small superior right patellar enthesophyte. No significant degenerative arthropathy. No radiopaque foreign body. IMPRESSION: No fracture, significant joint effusion or dislocation in the right knee. Electronically Signed   By: Delbert Phenix M.D.   On: 02/08/2019 16:26   Vas [place] Lower Extremity Venous (dvt) (only [facility])  Result Date: 02/08/2019  Lower Venous Study Indications: Knee pain and "cracking".  Comparison Study: No prior study on file for comparison Performing Technologist: Sherren Kerns RVS  Examination Guidelines: A complete evaluation includes B-mode imaging, spectral Doppler, color Doppler, and power Doppler as needed of all accessible portions of each vessel. Bilateral testing is considered an integral part of a complete examination. Limited examinations for reoccurring indications may be performed as noted.  +---------+---------------+---------+-----------+----------+-------+  RIGHT     Compressibility Phasicity Spontaneity Properties Summary  +---------+---------------+---------+-----------+----------+-------+  CFV       Full            Yes       Yes                             +---------+---------------+---------+-----------+----------+-------+  SFJ        Full                                                      +---------+---------------+---------+-----------+----------+-------+  FV Prox   Full                                                      +---------+---------------+---------+-----------+----------+-------+  FV Mid    Full                                                      +---------+---------------+---------+-----------+----------+-------+  FV Distal Full                                                      +---------+---------------+---------+-----------+----------+-------+  PFV       Full                                                      +---------+---------------+---------+-----------+----------+-------+  POP       Full            Yes       Yes                             +---------+---------------+---------+-----------+----------+-------+  PTV       Full                                                      +---------+---------------+---------+-----------+----------+-------+  PERO      Full                                                      +---------+---------------+---------+-----------+----------+-------+   +----+---------------+---------+-----------+----------+-------+  LEFT Compressibility Phasicity Spontaneity Properties Summary  +----+---------------+---------+-----------+----------+-------+  CFV  Full            Yes       Yes                             +----+---------------+---------+-----------+----------+-------+     Summary: Right: There is no evidence of deep vein thrombosis in the lower extremity. A cystic structure is found in the popliteal fossa. Left: No evidence of common femoral vein obstruction.  *See table(s) above for measurements and observations.    Preliminary     Procedures Procedures (including critical care time)  Medications Ordered in ED Medications - No data to display  Initial Impression / Assessment and Plan / ED Course  I have reviewed the triage vital signs and the nursing notes.  Pertinent labs &  imaging results that were available during my care of the patient were reviewed by me and considered in my medical decision making (see chart for details).  58 year old male appears otherwise well presents for evaluation of knee pain.  Afebrile, nonseptic, non-ill-appearing.  Patient with pain to medial joint line to right knee.  States pain improves with movement and is worse at night and when he first wakes up in the morning. States he also has some mild pain to his popliteal fossa which is been present x1 month.  He has no risk factors for DVT or PE.  He denies any chest pain or shortness of breath.  He has no tachycardia, tachypnea or hypoxia.  Normal musculoskeletal exam with full range of motion bilateral lower extremities.  Negative varus, valgus stress as well as anterior drawer.  Negative Homans sign. No significant edema, erythema, ecchymosis or warmth.  Brisk capillary refill and does not have any overlying skin changes to his lower extremities.  No joint effusion.  His plain film is negative for fracture, dislocation, joint effusion.  I will suspicion for septic joint, gout, hemarthrosis.  Denies any recent injury or trauma.  Denies history of gonorrhea or penile discharge or dysuria.  I have low suspicion for disseminated gonorrhea.  Mild crepitus with flexion and extension to bilateral knees.  Mild tenderness to his popliteal fossa however does not have any significant edema, erythema, ecchymosis or warmth.  Will obtain ultrasound to rule out DVT, however I have low suspicion for this at this time.  He is ambulatory in ED without difficulty.  Neurovascularly intact.  Plan, x-ray negative for acute fracture, dislocation or effusion.  [place] with Baker's cyst to right popliteal fossa.  Negative for DVT.  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  Patient states he did an increased amount of walking as well as repeated kneeling and standing.  Likely overuse injury.  Placed in knee  sleeve, NSAIDs, ice elevation and follow-up with orthopedics.  Patient is hemodynamically stable and in no acute distress.  Patient able to ambulate in department prior to ED.  Evaluation does not show acute pathology that would require ongoing or additional emergent interventions while in the  emergency department or further inpatient treatment.  I have discussed the diagnosis with the patient and answered all questions.  Pain is been managed while in the emergency department and patient has no further complaints prior to discharge.  Patient is comfortable with plan discussed in room and is stable for discharge at this time.  I have discussed strict return precautions for returning to the emergency department.  Patient was encouraged to follow-up with PCP/specialist refer to at discharge.  DDX: Septic joint, gout, hemarthrosis, disseminated gonorrhea, fracture, dislocation, infection, osteomyelitis, DVT, ligament or tendon injury.  Temp:  [98.4 F (36.9 C)] 98.4 F (36.9 C) (05/31 1529) Pulse Rate:  [67-72] 72 (05/31 1913) Resp:  [16-19] 16 (05/31 1913) BP: (132-168)/(88-120) 168/120 (05/31 1913) SpO2:  [98 %-100 %] 100 % (05/31 1913) Weight:  [104.3 kg] 104.3 kg (05/31 1532)     Final Clinical Impressions(s) / ED Diagnoses   Final diagnoses:  Acute pain of right knee    ED Discharge Orders         Ordered    naproxen (NAPROSYN) 500 MG tablet  2 times daily     02/08/19 1908           Dmarco Baldus A, PA-C 02/08/19 1916    Melene Plan, DO 02/08/19 1924

## 2019-07-08 ENCOUNTER — Encounter: Payer: Self-pay | Admitting: Family Medicine

## 2019-07-08 ENCOUNTER — Other Ambulatory Visit: Payer: Self-pay

## 2019-07-08 ENCOUNTER — Ambulatory Visit (INDEPENDENT_AMBULATORY_CARE_PROVIDER_SITE_OTHER): Payer: Self-pay | Admitting: Family Medicine

## 2019-07-08 VITALS — BP 160/104 | HR 64 | Temp 98.1°F | Ht 68.0 in | Wt 238.4 lb

## 2019-07-08 DIAGNOSIS — R7303 Prediabetes: Secondary | ICD-10-CM

## 2019-07-08 DIAGNOSIS — M2550 Pain in unspecified joint: Secondary | ICD-10-CM

## 2019-07-08 DIAGNOSIS — R002 Palpitations: Secondary | ICD-10-CM

## 2019-07-08 DIAGNOSIS — I10 Essential (primary) hypertension: Secondary | ICD-10-CM | POA: Insufficient documentation

## 2019-07-08 DIAGNOSIS — R350 Frequency of micturition: Secondary | ICD-10-CM

## 2019-07-08 DIAGNOSIS — E119 Type 2 diabetes mellitus without complications: Secondary | ICD-10-CM | POA: Insufficient documentation

## 2019-07-08 LAB — LIPID PANEL
Cholesterol: 303 mg/dL — ABNORMAL HIGH (ref 0–200)
HDL: 50.8 mg/dL (ref >39.00)
LDL Cholesterol: 221 mg/dL — ABNORMAL HIGH (ref 0–99)
NonHDL: 251.78
Total CHOL/HDL Ratio: 6
Triglycerides: 153 mg/dL — ABNORMAL HIGH (ref 0.0–149.0)
VLDL: 30.6 mg/dL (ref 0.0–40.0)

## 2019-07-08 LAB — COMPREHENSIVE METABOLIC PANEL
ALT: 33 U/L (ref 0–53)
AST: 19 U/L (ref 0–37)
Albumin: 4.3 g/dL (ref 3.5–5.2)
Alkaline Phosphatase: 88 U/L (ref 39–117)
BUN: 12 mg/dL (ref 6–23)
CO2: 32 mEq/L (ref 19–32)
Calcium: 9.1 mg/dL (ref 8.4–10.5)
Chloride: 100 mEq/L (ref 96–112)
Creatinine, Ser: 0.91 mg/dL (ref 0.40–1.50)
GFR: 103.81 mL/min (ref >60.00)
Glucose, Bld: 123 mg/dL — ABNORMAL HIGH (ref 70–99)
Potassium: 4.4 mEq/L (ref 3.5–5.1)
Sodium: 138 mEq/L (ref 135–145)
Total Bilirubin: 0.6 mg/dL (ref 0.2–1.2)
Total Protein: 7.2 g/dL (ref 6.0–8.3)

## 2019-07-08 LAB — CBC WITH DIFFERENTIAL/PLATELET
Basophils Absolute: 0.1 10*3/uL (ref 0.0–0.1)
Basophils Relative: 1 % (ref 0.0–3.0)
Eosinophils Absolute: 0.2 10*3/uL (ref 0.0–0.7)
Eosinophils Relative: 2.4 % (ref 0.0–5.0)
HCT: 41.1 % (ref 39.0–52.0)
Hemoglobin: 13.8 g/dL (ref 13.0–17.0)
Lymphocytes Relative: 26.7 % (ref 12.0–46.0)
Lymphs Abs: 2.1 10*3/uL (ref 0.7–4.0)
MCHC: 33.6 g/dL (ref 30.0–36.0)
MCV: 86.4 fl (ref 78.0–100.0)
Monocytes Absolute: 0.5 10*3/uL (ref 0.1–1.0)
Monocytes Relative: 6.2 % (ref 3.0–12.0)
Neutro Abs: 5 10*3/uL (ref 1.4–7.7)
Neutrophils Relative %: 63.7 % (ref 43.0–77.0)
Platelets: 256 10*3/uL (ref 150.0–400.0)
RBC: 4.76 Mil/uL (ref 4.22–5.81)
RDW: 13.3 % (ref 11.5–15.5)
WBC: 7.8 10*3/uL (ref 4.0–10.5)

## 2019-07-08 LAB — MICROALBUMIN / CREATININE URINE RATIO
Creatinine,U: 164.8 mg/dL
Microalb Creat Ratio: 2.6 mg/g (ref 0.0–30.0)
Microalb, Ur: 4.2 mg/dL — ABNORMAL HIGH (ref 0.0–1.9)

## 2019-07-08 MED ORDER — AMLODIPINE BESYLATE 10 MG PO TABS: 10.0000 mg | ORAL_TABLET | Freq: Every day | ORAL | 3 refills | Status: DC

## 2019-07-08 NOTE — Progress Notes (Signed)
Patient: Artist PaisBrandon D Hartel MRN: [id number] DOB: July 25, 1961 PCP: Orland MustardWolfe, Arnold Kester, MD     Subjective:  Chief Complaint  Patient presents with  . Hypertension  . Establish Care    HPI: The patient is a 58 y.o. male who presents today for establishing care. He has uncontrolled HTN, prediabetes and multiple concerns.   Hypertension: Here for follow up of hypertension.  Currently on diovan that he does not take regularly.  Does NOT take medication as prescribed and denies any side effects. Exercise includes none. Weight has been stable. Denies any chest pain, headaches, shortness of breath, vision changes, swelling in lower extremities. He takes this when he feels like his blood pressure is high. Appears to have visited ER frequently and have non compliance issues.   Prediabetes: he was diagnosed with pre diabetes over a year ago. He has been having increased urination and decided he needed to get back on his metformin so he started this back. His mother has diabetes. He is not a smoker. He was swimming until the pandemic hit. He does have blurry vision at times. He has had an eye exam done a few years ago and no diabetic damage at that time.   He is also having palpitations only when he sleeps. Nothing during the day and nothing when exercising. No chest pain at all. He does have minimal leg swelling, no cough. He does not smoke. +FH of heart attack in his father.   Swollen hands and joint pain: he has noticed this the past 2-3 years. His hands seem swollen and pain in his joints of his feet. He has tingling as well in his fingers and toes. ? Hx of RA in his mother. Stiff in AM.   Review of Systems  Constitutional: Negative for fatigue.  Eyes: Positive for visual disturbance.  Respiratory: Positive for shortness of breath.   Cardiovascular: Positive for palpitations. Negative for chest pain and leg swelling.  Gastrointestinal: Negative for abdominal pain, diarrhea, nausea and vomiting.   Genitourinary: Positive for frequency. Negative for dysuria and urgency.  Skin: Negative for rash.  Neurological: Negative for dizziness and headaches.  Psychiatric/Behavioral: Positive for sleep disturbance.    Allergies Patient has No Known Allergies.  Past Medical History Patient  has a past medical history of Diabetes mellitus without complication (HCC), Hypercholesterolemia, Hypertension, Pre-diabetes, and Rupture of UCL of right thumb.  Surgical History Patient  has a past surgical history that includes Ligament repair (Right, 11/14/2018).  Family History Pateint's family history includes Cancer in his mother.  Social History Patient  reports that he has never smoked. He has never used smokeless tobacco. He reports that he does not drink alcohol or use drugs.    Objective: Vitals:   07/08/19 1315  BP: (!) 160/104  Pulse: 64  Temp: 98.1 F (36.7 C)  TempSrc: Skin  SpO2: 96%  Weight: 238 lb 6.4 oz (108.1 kg)  Height: 5\' 8"  (1.727 m)    Body mass index is 36.25 kg/m.  Physical Exam Vitals signs reviewed.  Constitutional:      General: He is not in acute distress.    Appearance: He is obese.  HENT:     Head: Normocephalic and atraumatic.     Right Ear: There is impacted cerumen.     Left Ear: There is impacted cerumen.     Nose: Nose normal.     Mouth/Throat:     Mouth: Mucous membranes are moist.  Eyes:     Extraocular Movements: Extraocular  movements intact.     Conjunctiva/sclera: Conjunctivae normal.     Pupils: Pupils are equal, round, and reactive to light.  Neck:     Musculoskeletal: Normal range of motion and neck supple.  Cardiovascular:     Rate and Rhythm: Normal rate and regular rhythm.     Heart sounds: Normal heart sounds. No murmur.  Pulmonary:     Effort: Pulmonary effort is normal.     Breath sounds: Normal breath sounds.  Abdominal:     General: Bowel sounds are normal.     Palpations: Abdomen is soft.  Musculoskeletal:         General: Swelling (bilateral hands) present. No deformity.  Skin:    General: Skin is warm.     Capillary Refill: Capillary refill takes less than 2 seconds.     Comments: Acanthosis nigricans around neck  Neurological:     General: No focal deficit present.     Mental Status: He is alert and oriented to person, place, and time.     Comments: +phalen's on left wrist. Negative tinel's.   Psychiatric:        Mood and Affect: Mood normal.        Behavior: Behavior normal.        GAD 7 : Generalized Anxiety Score 07/08/2019  Nervous, Anxious, on Edge 1  Control/stop worrying 2  Worry too much - different things 2  Trouble relaxing 2  Restless 2  Easily annoyed or irritable 1  Afraid - awful might happen 0  Total GAD 7 Score 10  Anxiety Difficulty Not difficult at all    Depression screen [facility] 2/9 07/08/2019  Decreased Interest 0  Down, Depressed, Hopeless 0  PHQ - 2 Score 0    Assessment/plan: 1. Benign essential HTN Way out of control. Has not been controlled on multiple visits to ER. Non compliant with medication and unsure barrier if it's education or something else. Went over in detail risks of uncontrolled HTN and the multitude of his complaints could be due to this. Continue medication and adding on norvasc 10mg . Will slowly titrate him down to normal. Want him to keep a log for me and start to cut out salt and exercise. Will see him back in one month or sooner if needed. Has ekg from 2019 in his chart and reviewed today.  - Comprehensive metabolic panel - CBC with Differential/Platelet - Lipid panel - Microalbumin / creatinine urine ratio - TSH - ECHOCARDIOGRAM COMPLETE; Future  2. Prediabetes Will check labs and go from there. Continue current dose of metformin. Needs eyes checked could have HTN damage or diabetes damage.  - Hemoglobin A1c - TSH  3. Palpitations Only at night, nothing wth exertion. Checking labs and echo. If persists or gets worse will check zio.  Precautions given.  - ECHOCARDIOGRAM COMPLETE; Future  4. Arthralgia, unspecified joint Likely secondary to HTN and weight, but will check for RF. Decrease salt, lose weight and lets get blood pressure under control.  - Rheumatoid factor  5. Frequent urination Likely secondary to diabetes, but will check psa as well.  - PSA   Gad 7 only minimally elevated and could be secondary to severely uncontrolled diabetes. Has so many issues, that we addressed the most acute first. Will f/u on this at next visit and see if gets better with blood pressure control.    Return in about 1 month (around 08/08/2019) for blood pressure/diabetes and anxiety . 07/08/2019  07/10/2019, MD [facility]  [place] [facility]

## 2019-07-08 NOTE — Patient Instructions (Signed)
-for your blood pressure: starting back your norvasc at 10mg /day. Keep up the other blood pressure pill. See you back in one month. Keep a daily log and bring at next visit.   -for palpitations: ordering an ultrasound of your heart called an echo. They will call you to set this up. Also checking labs.   -diabetes: check labs and we will go from there. i'll call and make a plan  -swollen hand/joint pain: I think due to weight/blood pressure, but diabetes can cause neuropathy. Part of your tingling is also carpal tunnel. Checking for rheumatoid arthritis.    Hypertension, Adult Hypertension is another name for high blood pressure. High blood pressure forces your heart to work harder to pump blood. This can cause problems over time. There are two numbers in a blood pressure reading. There is a top number (systolic) over a bottom number (diastolic). It is best to have a blood pressure that is below 120/80. Healthy choices can help lower your blood pressure, or you may need medicine to help lower it. What are the causes? The cause of this condition is not known. Some conditions may be related to high blood pressure. What increases the risk?  Smoking.  Having type 2 diabetes mellitus, high cholesterol, or both.  Not getting enough exercise or physical activity.  Being overweight.  Having too much fat, sugar, calories, or salt (sodium) in your diet.  Drinking too much alcohol.  Having long-term (chronic) kidney disease.  Having a family history of high blood pressure.  Age. Risk increases with age.  Race. You may be at higher risk if you are African American.  Gender. Men are at higher risk than women before age 51. After age 74, women are at higher risk than men.  Having obstructive sleep apnea.  Stress. What are the signs or symptoms?  High blood pressure may not cause symptoms. Very high blood pressure (hypertensive crisis) may cause: ? Headache. ? Feelings of worry or  nervousness (anxiety). ? Shortness of breath. ? Nosebleed. ? A feeling of being sick to your stomach (nausea). ? Throwing up (vomiting). ? Changes in how you see. ? Very bad chest pain. ? Seizures. How is this treated?  This condition is treated by making healthy lifestyle changes, such as: ? Eating healthy foods. ? Exercising more. ? Drinking less alcohol.  Your health care provider may prescribe medicine if lifestyle changes are not enough to get your blood pressure under control, and if: ? Your top number is above 130. ? Your bottom number is above 80.  Your personal target blood pressure may vary. Follow these instructions at home: Eating and drinking   If told, follow the DASH eating plan. To follow this plan: ? Fill one half of your plate at each meal with fruits and vegetables. ? Fill one fourth of your plate at each meal with whole grains. Whole grains include whole-wheat pasta, brown rice, and whole-grain bread. ? Eat or drink low-fat dairy products, such as skim milk or low-fat yogurt. ? Fill one fourth of your plate at each meal with low-fat (lean) proteins. Low-fat proteins include fish, chicken without skin, eggs, beans, and tofu. ? Avoid fatty meat, cured and processed meat, or chicken with skin. ? Avoid pre-made or processed food.  Eat less than 1,500 mg of salt each day.  Do not drink alcohol if: ? Your doctor tells you not to drink. ? You are pregnant, may be pregnant, or are planning to become pregnant.  If  you drink alcohol: ? Limit how much you use to:  0-1 drink a day for women.  0-2 drinks a day for men. ? Be aware of how much alcohol is in your drink. In the [place] one drink equals one 12 oz bottle of beer (355 mL), one 5 oz glass of wine (148 mL), or one 1 oz glass of hard liquor (44 mL). Lifestyle   Work with your doctor to stay at a healthy weight or to lose weight. Ask your doctor what the best weight is for you.  Get at least 30 minutes of  exercise most days of the week. This may include walking, swimming, or biking.  Get at least 30 minutes of exercise that strengthens your muscles (resistance exercise) at least 3 days a week. This may include lifting weights or doing Pilates.  Do not use any products that contain nicotine or tobacco, such as cigarettes, e-cigarettes, and chewing tobacco. If you need help quitting, ask your doctor.  Check your blood pressure at home as told by your doctor.  Keep all follow-up visits as told by your doctor. This is important. Medicines  Take over-the-counter and prescription medicines only as told by your doctor. Follow directions carefully.  Do not skip doses of blood pressure medicine. The medicine does not work as well if you skip doses. Skipping doses also puts you at risk for problems.  Ask your doctor about side effects or reactions to medicines that you should watch for. Contact a doctor if you:  Think you are having a reaction to the medicine you are taking.  Have headaches that keep coming back (recurring).  Feel dizzy.  Have swelling in your ankles.  Have trouble with your vision. Get help right away if you:  Get a very bad headache.  Start to feel mixed up (confused).  Feel weak or numb.  Feel faint.  Have very bad pain in your: ? Chest. ? Belly (abdomen).  Throw up more than once.  Have trouble breathing. Summary  Hypertension is another name for high blood pressure.  High blood pressure forces your heart to work harder to pump blood.  For most people, a normal blood pressure is less than 120/80.  Making healthy choices can help lower blood pressure. If your blood pressure does not get lower with healthy choices, you may need to take medicine. This information is not intended to replace advice given to you by your health care provider. Make sure you discuss any questions you have with your health care provider. Document Released: 02/13/2008 Document  Revised: 05/07/2018 Document Reviewed: 05/07/2018 Elsevier Patient Education  2020 [employer].

## 2019-07-09 LAB — HEMOGLOBIN A1C: Hgb A1c MFr Bld: 6.7 % — ABNORMAL HIGH (ref 4.6–6.5)

## 2019-07-09 LAB — TSH: TSH: 2.95 u[IU]/mL (ref 0.35–4.50)

## 2019-07-09 LAB — PSA: PSA: 0.4 ng/mL (ref 0.10–4.00)

## 2019-07-09 LAB — RHEUMATOID FACTOR: Rheumatoid fact SerPl-aCnc: <14 IU/mL (ref <14)

## 2019-07-10 ENCOUNTER — Other Ambulatory Visit: Payer: Self-pay | Admitting: Family Medicine

## 2019-07-10 DIAGNOSIS — E1169 Type 2 diabetes mellitus with other specified complication: Secondary | ICD-10-CM | POA: Insufficient documentation

## 2019-07-10 MED ORDER — METFORMIN HCL 500 MG PO TABS: 500.0000 mg | ORAL_TABLET | Freq: Two times a day (BID) | ORAL | 1 refills | Status: DC

## 2019-07-10 MED ORDER — ROSUVASTATIN CALCIUM 10 MG PO TABS: 10.0000 mg | ORAL_TABLET | Freq: Every day | ORAL | 3 refills | Status: DC

## 2019-07-14 ENCOUNTER — Ambulatory Visit (HOSPITAL_COMMUNITY): Payer: Self-pay | Attending: Cardiology

## 2019-07-14 ENCOUNTER — Other Ambulatory Visit: Payer: Self-pay

## 2019-07-14 DIAGNOSIS — I10 Essential (primary) hypertension: Secondary | ICD-10-CM | POA: Insufficient documentation

## 2019-07-14 DIAGNOSIS — R002 Palpitations: Secondary | ICD-10-CM | POA: Insufficient documentation

## 2019-07-16 ENCOUNTER — Encounter: Payer: Self-pay | Admitting: Family Medicine

## 2019-07-16 DIAGNOSIS — I519 Heart disease, unspecified: Secondary | ICD-10-CM | POA: Insufficient documentation

## 2019-07-16 DIAGNOSIS — I5189 Other ill-defined heart diseases: Secondary | ICD-10-CM | POA: Insufficient documentation

## 2019-08-14 ENCOUNTER — Other Ambulatory Visit: Payer: Self-pay

## 2019-08-17 ENCOUNTER — Ambulatory Visit: Payer: Self-pay

## 2019-08-17 ENCOUNTER — Ambulatory Visit (INDEPENDENT_AMBULATORY_CARE_PROVIDER_SITE_OTHER): Payer: Self-pay | Admitting: Family Medicine

## 2019-08-17 ENCOUNTER — Encounter: Payer: Self-pay | Admitting: Family Medicine

## 2019-08-17 VITALS — BP 178/104 | HR 60 | Ht 68.0 in | Wt 239.0 lb

## 2019-08-17 VITALS — BP 178/104 | HR 60 | Temp 97.9°F | Ht 68.0 in | Wt 239.0 lb

## 2019-08-17 DIAGNOSIS — E785 Hyperlipidemia, unspecified: Secondary | ICD-10-CM

## 2019-08-17 DIAGNOSIS — G8929 Other chronic pain: Secondary | ICD-10-CM

## 2019-08-17 DIAGNOSIS — M25561 Pain in right knee: Secondary | ICD-10-CM

## 2019-08-17 DIAGNOSIS — I10 Essential (primary) hypertension: Secondary | ICD-10-CM

## 2019-08-17 DIAGNOSIS — E1169 Type 2 diabetes mellitus with other specified complication: Secondary | ICD-10-CM

## 2019-08-17 DIAGNOSIS — E119 Type 2 diabetes mellitus without complications: Secondary | ICD-10-CM

## 2019-08-17 MED ORDER — VALSARTAN-HYDROCHLOROTHIAZIDE 160-25 MG PO TABS: 1.0000 | ORAL_TABLET | Freq: Every day | ORAL | 1 refills | Status: DC

## 2019-08-17 NOTE — Progress Notes (Addendum)
Patient: Donald Cole MRN: [id number] DOB: 06-Feb-1961 PCP: Orland Mustard, MD     Subjective:  Chief Complaint  Patient presents with  . Hypertension  . Diabetes    HPI: The patient is a 58 y.o. male who presents today for follow up on hypertension and diabetes.  Has not been taking medications appropriately or exercising.  States that he feels the Metformin is causing strange side effects with his vision.   Hypertension: Here for follow up of hypertension.  Currently on NO MEDS .  DOES NOT take medication as prescribed and denies any side effects. Exercise includes none. Weight has been stable. Denies any chest pain, headaches, shortness of breath, vision changes, swelling in lower extremities. He did not take medication as prescribed. I wrote all instructions down for him, bu the is saying he was confused with  medication. He never called or emailed with questions. Echo was done.  Hyperlipidemia: cholesterol was through the roof and I started him on a statin. He never started this.   Diabetes:  a1c is 6.7. he was given metformin and did not start this.   Right knee: 3 months ago he states his "right knee went out." his whole leg was swollen. He woke up and noticed his knee was swollen. Denies any trauma to precipitate this. Went to urgent care in [place]. He states it was drained and given a steroid shot. Xray was done in May in system. If he stands up he has bad, sharp pain. No pain with side to side movement. Bending his knee makes it worse. Pain rated as a 5/10 and is sharp then goes away. Ibuprofen makes it better. No radiation. Pain localized to medial aspect of knee. Has tried knee braces.   Review of Systems  Constitutional: Positive for fatigue.  HENT: Negative for congestion, postnasal drip, rhinorrhea and sore throat.   Eyes: Negative for visual disturbance.  Respiratory: Negative for shortness of breath.   Cardiovascular: Positive for palpitations. Negative for  chest pain and leg swelling.  Gastrointestinal: Negative for abdominal pain, diarrhea, nausea and vomiting.  Endocrine: Negative for cold intolerance, heat intolerance, polydipsia and polyuria.  Genitourinary: Positive for frequency. Negative for dysuria and urgency.  Neurological: Negative for dizziness.  Psychiatric/Behavioral: Positive for sleep disturbance.    Allergies Patient has No Known Allergies.  Past Medical History Patient  has a past medical history of Diabetes mellitus without complication (HCC), Hypercholesterolemia, Hypertension, Pre-diabetes, and Rupture of UCL of right thumb.  Surgical History Patient  has a past surgical history that includes Ligament repair (Right, 11/14/2018).  Family History Pateint's family history includes Cancer in his mother.  Social History Patient  reports that he has never smoked. He has never used smokeless tobacco. He reports that he does not drink alcohol or use drugs.    Objective: Vitals:   08/17/19 1340  BP: (!) 178/104  Pulse: 60  Temp: 97.9 F (36.6 C)  TempSrc: Skin  SpO2: 94%  Weight: 239 lb (108.4 kg)  Height: 5\' 8"  (1.727 m)    Body mass index is 36.34 kg/m.  Physical Exam Vitals signs reviewed.  Constitutional:      Appearance: Normal appearance. He is obese.  HENT:     Head: Normocephalic and atraumatic.  Cardiovascular:     Rate and Rhythm: Normal rate and regular rhythm.     Heart sounds: Normal heart sounds.  Pulmonary:     Effort: Pulmonary effort is normal.     Breath sounds: Normal  breath sounds.  Abdominal:     General: Bowel sounds are normal.     Palpations: Abdomen is soft.  Musculoskeletal:        General: Swelling and tenderness present.     Comments: Right knee: knee is warm to touch. TTP over medial aspect of knee/meniscal area. Has full extension to 180 degrees and full flexion to 90 degree with no pain. Can fully weight bear. No pain with valgus or varus movement. Does have limping gait.    Neurological:     General: No focal deficit present.     Mental Status: He is alert and oriented to person, place, and time.  Psychiatric:        Mood and Affect: Mood normal.        Behavior: Behavior normal.    Reviewed knee xray from 01/2019     Assessment/plan: 1. Hyperlipidemia associated with type 2 diabetes mellitus ([place]) Start statin. Wrote all instructions down and again discussed if he doesn't know what medication is for he needs to call/ask/email not just not take it. cholesterol is through the roof and with all of his co morbidities he is at high risk of mi/cva/complications.   2. Diabetes mellitus without complication (HCC) R4W of 6.7. has tolerated metformin fine in the past. Never got his eyes checked and discussed at last visit likely secondary to damage from uncontrolled HTN and not medication as he was having issues before he even started medication. Want him to start back his metformin as we discussed at last visit. Very low dose and a1c not far from goal. Start statin and get eyes checked.   3. Benign essential HTN Extremely elevated. No HTN urgency. Again discussed I am unsure why he would just not take his medication. I wrote everything down for him and do not understand why he wouldn't call with questions. He also told me he had his diovan at home and had none. We are going to start him back on diovan and increase the hctz. Since complaining of "swelling" will hold off on norvasc. See him back in one month. Discussed risks of uncontrolled HTN and likely cause of his issues with vision/urination. Also discussed if he continues to be non compliant I will fire him as a patient. He is at risk for multiple issues and needs to start medication. If any questions he can always call.   4. Chronic pain of right knee ? Concern for meniscal injury vs. Baker's cyst. No insurance so no MRI at this point. Will send to sports medicine who can see him today.    Return in about 1 month  (around 09/17/2019) for blood pressure .  This visit occurred during the SARS-CoV-2 public health emergency.  Safety protocols were in place, including screening questions prior to the visit, additional usage of staff PPE, and extensive cleaning of exam room while observing appropriate contact time as indicated for disinfecting solutions.    Orma Flaming, MD [place]   08/17/2019

## 2019-08-17 NOTE — Progress Notes (Signed)
Subjective:    I'm seeing this patient as a consultation for:  Dr. Rogers Blocker.  Note will be routed back to referring provider.  CC: R knee pain  HPI: Pt is a 58 y/o male presenting w/ c/o R medial knee pain x months.  Pt was seen back in May 2020 in [place] at some type of Urgent Care facility and had an aspiration/injection.  He states that the injection helped his knee pain until about a month ago when his knee pain flared up.  He has been using some topical gels/creams and taking IBU.  He has also been using a compression sleeve.  He reports R knee swelling and mechanical symptoms.  Aggravating factors include walking, descending stairs and loaded rotation.  Past medical history, Surgical history, Family history not pertinant except as noted below, Social history, Allergies, and medications have been entered into the medical record, reviewed, and no changes needed.   Review of Systems: No headache, visual changes, nausea, vomiting, diarrhea, constipation, dizziness, abdominal pain, skin rash, fevers, chills, night sweats, weight loss, swollen lymph nodes, body aches, joint swelling, muscle aches, chest pain, shortness of breath, mood changes, visual or auditory hallucinations.   Objective:    Vitals:   08/17/19 1424  BP: (!) 178/104  Pulse: 60  SpO2: 94%   General: Well Developed, well nourished, and in no acute distress.  Neuro/Psych: Alert and oriented x3, extra-ocular muscles intact, able to move all 4 extremities, sensation grossly intact. Skin: Warm and dry, no rashes noted.  Respiratory: Not using accessory muscles, speaking in full sentences, trachea midline.  Cardiovascular: Pulses palpable, no extremity edema. Abdomen: Does not appear distended. MSK: Right knee: Moderate effusion otherwise normal-appearing. Range of motion 0-100 degrees with crepitation. Tender palpation medial and lateral joint line. Stable ligamentous exam. Negative McMurray's test.   Lab and Radiology  Results  EXAM: RIGHT KNEE - COMPLETE 4+ VIEW 02/08/19  COMPARISON:  10/01/2017 right knee radiographs  FINDINGS: No fracture, significant joint effusion or dislocation. No suspicious focal osseous lesion. Stable small superior right patellar enthesophyte. No significant degenerative arthropathy. No radiopaque foreign body.  IMPRESSION: No fracture, significant joint effusion or dislocation in the right knee.   Electronically Signed   By: Ilona Sorrel M.D.   On: 02/08/2019 16:26 I, Lynne Leader, personally (independently) visualized and performed the interpretation of the images attached in this note.  Procedure: Real-time Ultrasound Guided Injection of right knee Device: Philips Affiniti 50G Images permanently stored and available for review in the ultrasound unit. Verbal informed consent obtained.  Discussed risks and benefits of procedure. Warned about infection bleeding damage to structures skin hypopigmentation and fat atrophy among others. Patient expresses understanding and agreement Time-out conducted.   Noted no overlying erythema, induration, or other signs of local infection.   Skin prepped in a sterile fashion.   Local anesthesia: Topical Ethyl chloride.   With sterile technique and under real time ultrasound guidance:  40 mg of Kenalog and 4 mL of Marcaine injected easily.   Completed without difficulty   Pain immediately resolved suggesting accurate placement of the medication.   Advised to call if fevers/chills, erythema, induration, drainage, or persistent bleeding.   Images permanently stored and available for review in the ultrasound unit.  Impression: Technically successful ultrasound guided injection.        Impression and Recommendations:    Assessment and Plan: 58 y.o. male with right knee pain and swelling.  Due to DJD exacerbation versus degenerative meniscus tear.  Gout is also on the differential but less likely.  Plan for treatment with  steroid injection as well as topical diclofenac gel.  Recheck back if not improving.  Precautions reviewed.Marland Kitchen  PDMP not reviewed this encounter. Orders Placed This Encounter  Procedures  . NO CHG - [place] LOWER RIGHT    Order Specific Question:   Reason for Exam (SYMPTOM  OR DIAGNOSIS REQUIRED)    Answer:   R knee pain    Order Specific Question:   Preferred imaging location?    Answer:   [facility]   No orders of the defined types were placed in this encounter.   Discussed warning signs or symptoms. Please see discharge instructions. Patient expresses understanding.  The above documentation has been reviewed and is accurate and complete Clementeen Graham

## 2019-08-17 NOTE — Patient Instructions (Addendum)
1) crestor (rousavastatin) is for cholesterol. You take this once a night. NEED this drug!!!   2) metformin: you have diabetes and will take this medication twice a day  3) losartan-hctz: this is for blood pressure, I will send this in since you do not have like you said you did.   4) DO NOT START amlodipine. Will just try the losartan-hctz and see you back in one month.

## 2019-08-17 NOTE — Patient Instructions (Signed)
Thank you for coming in today. Continue the voltaren gel on the knee 4x daily.  The shot should start helping soon.  Call or go to the ER if you develop a large red swollen joint with extreme pain or oozing puss.  Let me know if you are not improving or if it comes back.    We're moving!  Dr. Clovis Riley new office will be located at [address] on the 1st floor.  This location is across the street from the [employer] and in the same complex as the [facility] and [employer]  Our new office phone number will be [phone].  We anticipate beginning to see patients at the [facility] office in early December 2020.

## 2019-09-18 ENCOUNTER — Other Ambulatory Visit: Payer: Self-pay

## 2019-09-21 ENCOUNTER — Ambulatory Visit: Payer: Self-pay | Admitting: Family Medicine

## 2019-09-21 NOTE — Progress Notes (Deleted)
Patient: Donald Cole MRN: [id number] DOB: Feb 28, 1961 PCP: Orland Mustard, MD     Subjective:  No chief complaint on file.   HPI: The patient is a 59 y.o. male who presents today for ***  Review of Systems  Allergies Patient has No Known Allergies.  Past Medical History Patient  has a past medical history of Diabetes mellitus without complication (HCC), Hypercholesterolemia, Hypertension, Pre-diabetes, and Rupture of UCL of right thumb.  Surgical History Patient  has a past surgical history that includes Ligament repair (Right, 11/14/2018).  Family History Pateint's family history includes Cancer in his mother.  Social History Patient  reports that he has never smoked. He has never used smokeless tobacco. He reports that he does not drink alcohol or use drugs.    Objective: There were no vitals filed for this visit.  There is no height or weight on file to calculate BMI.  Physical Exam     Assessment/plan:      No follow-ups on file.     @AWME @ 09/21/2019

## 2019-09-28 LAB — HM DIABETES EYE EXAM

## 2019-10-06 ENCOUNTER — Encounter: Payer: Self-pay | Admitting: Family Medicine

## 2019-11-04 ENCOUNTER — Ambulatory Visit: Payer: Self-pay | Admitting: Family Medicine

## 2019-12-14 ENCOUNTER — Ambulatory Visit: Payer: Self-pay | Admitting: Family Medicine

## 2020-02-25 ENCOUNTER — Other Ambulatory Visit: Payer: Self-pay

## 2020-02-25 ENCOUNTER — Emergency Department (HOSPITAL_BASED_OUTPATIENT_CLINIC_OR_DEPARTMENT_OTHER): Payer: Self-pay

## 2020-02-25 ENCOUNTER — Encounter (HOSPITAL_BASED_OUTPATIENT_CLINIC_OR_DEPARTMENT_OTHER): Payer: Self-pay | Admitting: *Deleted

## 2020-02-25 ENCOUNTER — Emergency Department (HOSPITAL_BASED_OUTPATIENT_CLINIC_OR_DEPARTMENT_OTHER)
Admission: EM | Admit: 2020-02-25 | Discharge: 2020-02-25 | Disposition: A | Discharge status: Home / Self Care | Payer: Self-pay | Attending: Emergency Medicine | Admitting: Emergency Medicine

## 2020-02-25 DIAGNOSIS — X501XXA Overexertion from prolonged static or awkward postures, initial encounter: Secondary | ICD-10-CM | POA: Insufficient documentation

## 2020-02-25 DIAGNOSIS — Y929 Unspecified place or not applicable: Secondary | ICD-10-CM | POA: Insufficient documentation

## 2020-02-25 DIAGNOSIS — Y939 Activity, unspecified: Secondary | ICD-10-CM | POA: Insufficient documentation

## 2020-02-25 DIAGNOSIS — E119 Type 2 diabetes mellitus without complications: Secondary | ICD-10-CM | POA: Insufficient documentation

## 2020-02-25 DIAGNOSIS — M25562 Pain in left knee: Secondary | ICD-10-CM

## 2020-02-25 DIAGNOSIS — Y999 Unspecified external cause status: Secondary | ICD-10-CM | POA: Insufficient documentation

## 2020-02-25 DIAGNOSIS — W1839XA Other fall on same level, initial encounter: Secondary | ICD-10-CM | POA: Insufficient documentation

## 2020-02-25 DIAGNOSIS — Z7984 Long term (current) use of oral hypoglycemic drugs: Secondary | ICD-10-CM | POA: Insufficient documentation

## 2020-02-25 DIAGNOSIS — S8992XA Unspecified injury of left lower leg, initial encounter: Secondary | ICD-10-CM | POA: Insufficient documentation

## 2020-02-25 DIAGNOSIS — I10 Essential (primary) hypertension: Secondary | ICD-10-CM | POA: Insufficient documentation

## 2020-02-25 MED ORDER — NAPROXEN 375 MG PO TABS: ORAL_TABLET | ORAL | 0 refills | Status: DC

## 2020-02-25 MED ORDER — LIDOCAINE-EPINEPHRINE 1 %-1:100000 IJ SOLN: 10.0000 mL | Freq: Once | INTRAMUSCULAR | Status: AC

## 2020-02-25 MED ORDER — LIDOCAINE-EPINEPHRINE 1 %-1:100000 IJ SOLN
INTRAMUSCULAR | Status: AC
  Administered 2020-02-25: 10 mL via INTRADERMAL
  Filled 2020-02-25: qty 1

## 2020-02-25 MED ORDER — NAPROXEN 250 MG PO TABS
500.0000 mg | ORAL_TABLET | Freq: Once | ORAL | Status: AC
  Administered 2020-02-25: 500 mg via ORAL
  Filled 2020-02-25: qty 2

## 2020-02-25 MED ORDER — HYDROCODONE-ACETAMINOPHEN 5-325 MG PO TABS: 1.0000 | ORAL_TABLET | ORAL | 0 refills | Status: DC | PRN

## 2020-02-25 NOTE — ED Triage Notes (Signed)
Left knee pain that started today after pt was standing up and the knee 'gave out' causing him to twist his knee.

## 2020-02-25 NOTE — ED Notes (Signed)
ED Provider at bedside. 

## 2020-02-25 NOTE — ED Provider Notes (Signed)
[facility] DEPT [facility] Provider Note: Georgena Spurling, MD, FACEP  CSN: [id number] MRN: [id number] ARRIVAL: 02/25/20 at [place]: [place]  Knee Injury   HISTORY OF PRESENT ILLNESS  02/25/20 5:37 AM Donald Cole is a 59 y.o. male who has had increasing pain and swelling in his left knee for the past 4 days.  Yesterday his left  "gave out" and he twisted it as he fell.  He is now having pain in his left knee which is deep in the joint.  He rates the pain is a 10 out of 10, worse with movement or weightbearing.  Flexion of the knee causes the most pain.   Past Medical History:  Diagnosis Date   Diabetes mellitus without complication ([place])    Hypercholesterolemia    Hypertension    non compliant with amlodipine   Pre-diabetes    Rupture of UCL of right thumb     Past Surgical History:  Procedure Laterality Date   LIGAMENT REPAIR Right 11/14/2018   Procedure: RIGHT THUMB ULNAR COLLATERAL LIGAMENT REPAIR;  Surgeon: Charlotte Crumb, MD;  Location: [facility];  Service: Orthopedics;  Laterality: Right;    Family History  Problem Relation Age of Onset   Cancer Mother     Social History   Tobacco Use   Smoking status: Never Smoker   Smokeless tobacco: Never Used  Vaping Use   Vaping Use: Never used  Substance Use Topics   Alcohol use: No   Drug use: No    Prior to Admission medications   Medication Sig Start Date End Date Taking? Authorizing Provider  HYDROcodone-acetaminophen (NORCO) 5-325 MG tablet Take 1 tablet by mouth every 4 (four) hours as needed for severe pain. 02/25/20   Anyla Israelson, Jenny Reichmann, MD  metFORMIN (GLUCOPHAGE) 500 MG tablet Take 1 tablet (500 mg total) by mouth 2 (two) times daily with a meal. 07/10/19   Orma Flaming, MD  naproxen (NAPROSYN) 375 MG tablet Take 1 tablet twice daily as needed for pain. 02/25/20   Raveen Wieseler, MD  rosuvastatin (CRESTOR) 10 MG tablet Take 1 tablet (10 mg total) by mouth daily.  07/10/19   Orma Flaming, MD  valsartan-hydrochlorothiazide (DIOVAN HCT) 160-25 MG tablet Take 1 tablet by mouth daily. 08/17/19   Orma Flaming, MD    Allergies Patient has no known allergies.   REVIEW OF SYSTEMS  Negative except as noted here or in the History of Present Illness.   PHYSICAL EXAMINATION  Initial Vital Signs Blood pressure 131/71, pulse 62, temperature 98.5 F (36.9 C), temperature source Oral, resp. rate 16, height 5\' 8"  (1.727 m), weight 104.3 kg, SpO2 98 %.  Examination General: Well-developed, well-nourished male in no acute distress; appearance consistent with age of record HENT: normocephalic; atraumatic Eyes: Normal appearance Neck: supple Heart: regular rate and rhythm Lungs: clear to auscultation bilaterally Abdomen: soft; nondistended; nontender; bowel sounds present Extremities: No deformity; pulses normal; stable left knee with pain on flexion Neurologic: Awake, alert and oriented; motor function intact in all extremities and symmetric; no facial droop Skin: Warm and dry Psychiatric: Normal mood and affect   RESULTS  Summary of this visit's results, reviewed and interpreted by myself:   EKG Interpretation  Date/Time:    Ventricular Rate:    PR Interval:    QRS Duration:   QT Interval:    QTC Calculation:   R Axis:     Text Interpretation:        Laboratory Studies: No  results found for this or any previous visit (from the past 24 hour(s)). Imaging Studies: DG Knee Complete 4 Views Left  Result Date: 02/25/2020 CLINICAL DATA:  Left knee pain, twisting injury EXAM: LEFT KNEE - COMPLETE 4+ VIEW COMPARISON:  None. FINDINGS: No acute bony abnormality. Specifically, no fracture, subluxation, or dislocation. Joint spaces maintained. No joint effusion. Enthesopathic changes along the superior pole of the patella. IMPRESSION: No acute bony abnormality. Electronically Signed   By: Charlett Nose M.D.   On: 02/25/2020 02:13    ED COURSE and MDM    Nursing notes, initial and subsequent vitals signs, including pulse oximetry, reviewed and interpreted by myself.  Vitals:   02/25/20 0134 02/25/20 0135 02/25/20 0448  BP: (!) 136/100  131/71  Pulse: 74  62  Resp: 18  16  Temp: 98.5 F (36.9 C)    TempSrc: Oral    SpO2: 98%  98%  Weight:  104.3 kg   Height:  5\' 8"  (1.727 m)    Medications  lidocaine-EPINEPHrine (XYLOCAINE W/EPI) 1 %-1:100000 (with pres) injection 10 mL (has no administration in time range)  naproxen (NAPROSYN) tablet 500 mg (has no administration in time range)    Unable to obtain joint fluid aspirate.  There was a concern for gout given that he had pain prior to his injury.  We will mobilize his knee and provide crutches as he states he cannot bear weight.  Will refer to orthopedics if symptoms persist.  PROCEDURES  .Joint Aspiration/Arthrocentesis  Date/Time: 02/25/2020 5:56 AM Performed by: Karlton Maya, MD Authorized by: Paytyn Mesta, MD   Consent:    Consent obtained:  Verbal   Consent given by:  Patient   Risks discussed:  Pain, infection and bleeding   Alternatives discussed:  No treatment Location:    Location:  Knee   Knee:  L knee Anesthesia (see MAR for exact dosages):    Anesthesia method:  Local infiltration   Local anesthetic:  Lidocaine 1% WITH epi Procedure details:    Preparation: Patient was prepped and draped in usual sterile fashion     Needle gauge:  18 G   Ultrasound guidance: no     Approach:  Medial   Aspirate amount:  None   Steroid injected: no     Specimen collected: no   Post-procedure details:    Dressing:  Adhesive bandage   Patient tolerance of procedure:  Tolerated well, no immediate complications   ED DIAGNOSES     ICD-10-CM   1. Injury of left knee, initial encounter  S89.92XA   2. Acute pain of left knee  M25.562        Munir Victorian, 02/27/2020, MD 02/25/20 [phone]

## 2020-03-18 ENCOUNTER — Emergency Department (HOSPITAL_COMMUNITY)
Admission: EM | Admit: 2020-03-18 | Discharge: 2020-03-18 | Disposition: A | Discharge status: Home / Self Care | Payer: Self-pay | Attending: Emergency Medicine | Admitting: Emergency Medicine

## 2020-03-18 ENCOUNTER — Encounter (HOSPITAL_COMMUNITY): Payer: Self-pay

## 2020-03-18 DIAGNOSIS — M7989 Other specified soft tissue disorders: Secondary | ICD-10-CM | POA: Insufficient documentation

## 2020-03-18 DIAGNOSIS — Z5321 Procedure and treatment not carried out due to patient leaving prior to being seen by health care provider: Secondary | ICD-10-CM | POA: Insufficient documentation

## 2020-03-18 NOTE — ED Triage Notes (Signed)
Arrived POV from home. Patient reports LLE swelling and that he feels a knot behind his left knee. Patient states left leg swelling has increased since being seen on 02/25/20.

## 2020-03-18 NOTE — ED Notes (Signed)
Per registration staff, pt was seen leaving the lobby Pt not in lobby or outside

## 2020-04-28 ENCOUNTER — Other Ambulatory Visit: Payer: Self-pay | Admitting: Physician Assistant

## 2020-04-28 ENCOUNTER — Encounter: Payer: Self-pay | Admitting: Physician Assistant

## 2020-04-28 ENCOUNTER — Ambulatory Visit (INDEPENDENT_AMBULATORY_CARE_PROVIDER_SITE_OTHER): Payer: Self-pay | Admitting: Orthopaedic Surgery

## 2020-04-28 ENCOUNTER — Telehealth: Payer: Self-pay | Admitting: Physician Assistant

## 2020-04-28 DIAGNOSIS — M25562 Pain in left knee: Secondary | ICD-10-CM

## 2020-04-28 DIAGNOSIS — G8929 Other chronic pain: Secondary | ICD-10-CM

## 2020-04-28 MED ORDER — TRAMADOL HCL 50 MG PO TABS: 50.0000 mg | ORAL_TABLET | Freq: Three times a day (TID) | ORAL | 0 refills | Status: DC | PRN

## 2020-04-28 NOTE — Telephone Encounter (Signed)
Called patient. No answer. Rx sent to pharm.

## 2020-04-28 NOTE — Telephone Encounter (Signed)
Were you suppose to send in Rx for Tramadol. This was not called in. Please send to pharm. Thanks.

## 2020-04-28 NOTE — Progress Notes (Signed)
Office Visit Note   Patient:  Donald Cole           Date of Birth: February 21, 1961           MRN: [id number] Visit Date: 04/28/2020              Requested by: Orland Mustard, MD [address] [place],  [place] [address] PCP: Orland Mustard, MD   Assessment & Plan: Visit Diagnoses:  1. Chronic pain of left knee     Plan: Impression is left knee questionable medial meniscus tear.  As the patient has failed cortisone injections I feel it is appropriate to proceed with MRI to assess his medial meniscus.  he will follow up with [place] once mri has been completed.  Follow-Up Instructions: Return for fu after MRI.   Orders:  No orders of the defined types were placed in this encounter.  No orders of the defined types were placed in this encounter.     Procedures: No procedures performed   Clinical Data: No additional findings.   Subjective: Chief Complaint  Patient presents with  . Right Knee - Pain  . Left Knee - Pain    HPI patient is a pleasant 59 year old gentleman comes in today with left knee pain.  This has been ongoing for the past 2 months.  He notes that recently he pivoted and had significant pain to the medial aspect.  He was seen in the ED where x-rays were obtained.  These were negative for fracture.  He comes in today for further evaluation treatment recommendation.  All of his pain is to the medial aspect.  He describes this as a constant tooth ache worse with pivoting.  He has tried arthritis cream without significant relief of symptoms.  He does note that he had a cortisone injection about a month ago which did not help at all.  He denies any history of gout.  No fevers or chills or any other systemic symptoms.  Review of Systems as detailed in HPI.  All others reviewed and are negative.   Objective: Vital Signs: There were no vitals taken for this visit.  Physical Exam well-developed well-nourished gentleman in no acute distress.  Alert and oriented  x3.  Ortho Exam examination of his left knee shows a trace effusion.  Range of motion 0 to 100 degrees.  Marked tenderness medial joint line.  He does have slight tenderness along the MCL.  He is stable valgus varus stress and without pain he is neurovascular intact distally.  Specialty Comments:  No specialty comments available.  Imaging: X-rays reviewed by me in canopy reveal minimal degenerative changes   PMFS History: Patient Active Problem List   Diagnosis Date Noted  . Grade I diastolic dysfunction 07/16/2019  . Hyperlipidemia associated with type 2 diabetes mellitus (HCC) 07/10/2019  . Diabetes mellitus without complication (HCC) 07/08/2019  . Benign essential HTN 07/08/2019  . Gamekeeper's thumb of right hand 11/11/2018   Past Medical History:  Diagnosis Date  . Diabetes mellitus without complication (HCC)   . Hypercholesterolemia   . Hypertension    non compliant with amlodipine  . Pre-diabetes   . Rupture of UCL of right thumb     Family History  Problem Relation Age of Onset  . Cancer Mother     Past Surgical History:  Procedure Laterality Date  . LIGAMENT REPAIR Right 11/14/2018   Procedure: RIGHT THUMB ULNAR COLLATERAL LIGAMENT REPAIR;  Surgeon: Dairl Ponder, MD;  Location: [facility];  Service: Orthopedics;  Laterality: Right;   Social History   Occupational History  . Not on file  Tobacco Use  . Smoking status: Never Smoker  . Smokeless tobacco: Never Used  Vaping Use  . Vaping Use: Never used  Substance and Sexual Activity  . Alcohol use: No  . Drug use: No  . Sexual activity: Yes    Birth control/protection: None

## 2020-04-28 NOTE — Telephone Encounter (Signed)
I just sent in

## 2020-04-28 NOTE — Telephone Encounter (Signed)
Patient called. Says he is at [facility] on  [address] to pick up Tramadol but they do not have the RX. His call back number is [phone]

## 2020-05-12 ENCOUNTER — Telehealth: Payer: Self-pay

## 2020-05-12 NOTE — Telephone Encounter (Signed)
Patient called patient is having a reaction to medication at night, stated having pressure in his head and thinks  medication might be too strong. Call back [phone]

## 2020-05-13 ENCOUNTER — Telehealth: Payer: Self-pay

## 2020-05-13 ENCOUNTER — Other Ambulatory Visit: Payer: Self-pay | Admitting: Surgical

## 2020-05-13 MED ORDER — ACETAMINOPHEN-CODEINE #3 300-30 MG PO TABS: 1.0000 | ORAL_TABLET | Freq: Three times a day (TID) | ORAL | 0 refills | Status: DC | PRN

## 2020-05-13 NOTE — Telephone Encounter (Signed)
Can Luke send in Rx for Tylenol #3 for patient, per Mardella Layman?  See previous message in chart.  Thank you.

## 2020-05-13 NOTE — Telephone Encounter (Signed)
Have him stop taking this to see if it makes a difference.  Can call in tylenol 3 1 every 8 hours prn #30 no refills if needs something for pain

## 2020-05-13 NOTE — Telephone Encounter (Signed)
Called and left a VM advising patient of message below per Grace.

## 2020-05-13 NOTE — Telephone Encounter (Signed)
submitted

## 2020-05-13 NOTE — Telephone Encounter (Signed)
Can you please send in?  See message below from [place].    Have him stop taking this to see if it makes a difference.  Can call in tylenol 3 1 every 8 hours prn #30 no refills if needs something for pain

## 2020-05-18 ENCOUNTER — Telehealth: Payer: Self-pay | Admitting: Orthopaedic Surgery

## 2020-05-18 NOTE — Telephone Encounter (Signed)
Only left knee MRI is indicated at this time.  Would need evaluation of the other knee prior to MRI

## 2020-05-18 NOTE — Telephone Encounter (Signed)
Ok? Or just the one knee?

## 2020-05-18 NOTE — Telephone Encounter (Signed)
Patient called requesting Dr. Roda Shutters send a referral for a MRI for both knees. Patient also requesting a call back once referral has been sent to facility of doctor's choice.  Patient called and left Sabrina a VM but she is on vacation. Please send referral for MRI for both knees and return call to patient at [phone].

## 2020-05-19 NOTE — Telephone Encounter (Signed)
Patient aware of the below message  

## 2020-06-12 ENCOUNTER — Encounter (HOSPITAL_BASED_OUTPATIENT_CLINIC_OR_DEPARTMENT_OTHER): Payer: Self-pay | Admitting: Emergency Medicine

## 2020-06-12 ENCOUNTER — Other Ambulatory Visit: Payer: Self-pay

## 2020-06-12 ENCOUNTER — Emergency Department (HOSPITAL_BASED_OUTPATIENT_CLINIC_OR_DEPARTMENT_OTHER)
Admission: EM | Admit: 2020-06-12 | Discharge: 2020-06-12 | Disposition: A | Discharge status: Home / Self Care | Payer: Self-pay | Attending: Emergency Medicine | Admitting: Emergency Medicine

## 2020-06-12 DIAGNOSIS — M25562 Pain in left knee: Secondary | ICD-10-CM | POA: Insufficient documentation

## 2020-06-12 DIAGNOSIS — E119 Type 2 diabetes mellitus without complications: Secondary | ICD-10-CM | POA: Insufficient documentation

## 2020-06-12 DIAGNOSIS — G8929 Other chronic pain: Secondary | ICD-10-CM | POA: Insufficient documentation

## 2020-06-12 DIAGNOSIS — Z79899 Other long term (current) drug therapy: Secondary | ICD-10-CM | POA: Insufficient documentation

## 2020-06-12 DIAGNOSIS — Z7984 Long term (current) use of oral hypoglycemic drugs: Secondary | ICD-10-CM | POA: Insufficient documentation

## 2020-06-12 DIAGNOSIS — I10 Essential (primary) hypertension: Secondary | ICD-10-CM | POA: Insufficient documentation

## 2020-06-12 MED ORDER — CELECOXIB 200 MG PO CAPS
200.0000 mg | ORAL_CAPSULE | Freq: Two times a day (BID) | ORAL | 1 refills | Status: DC
Start: 2020-06-12 — End: 2020-06-26

## 2020-06-12 NOTE — ED Provider Notes (Signed)
[facility] Provider Note: Lowella Dell, MD, FACEP  CSN: [id number] MRN: [id number] ARRIVAL: 06/12/20 at 0056 ROOM: [facility]05/[facility]   CHIEF COMPLAINT  Knee Pain   HISTORY OF PRESENT ILLNESS  06/12/20 2:49 AM Donald Cole is a 59 y.o. male who has had bilateral knee pain for about a year.  His left knee has been worsening over the past 4 months in his right knee worsened over the past several days.  He has been seen for this before and was referred to a specialist but states he has been "getting the run around" in terms of follow-up.  He has been taking Aleve which is no longer treating his pain.  He also has Tylenol with codeine but avoids taking that when driving.  He would like a referral to another specialist for follow-up.  His mother had a history of rheumatoid arthritis and is concerned he may be developing rheumatoid arthritis as he is also having some pain in his finger joints.   Past Medical History:  Diagnosis Date  . Diabetes mellitus without complication (HCC)   . Hypercholesterolemia   . Hypertension    non compliant with amlodipine  . Pre-diabetes   . Rupture of UCL of right thumb     Past Surgical History:  Procedure Laterality Date  . LIGAMENT REPAIR Right 11/14/2018   Procedure: RIGHT THUMB ULNAR COLLATERAL LIGAMENT REPAIR;  Surgeon: Dairl Ponder, MD;  Location: [facility];  Service: Orthopedics;  Laterality: Right;    Family History  Problem Relation Age of Onset  . Cancer Mother     Social History   Tobacco Use  . Smoking status: Never Smoker  . Smokeless tobacco: Never Used  Vaping Use  . Vaping Use: Never used  Substance Use Topics  . Alcohol use: No  . Drug use: No    Prior to Admission medications   Medication Sig Start Date End Date Taking? Authorizing Provider  acetaminophen-codeine (TYLENOL #3) 300-30 MG tablet Take 1 tablet by mouth every 8 (eight) hours as needed for moderate pain. 05/13/20  Yes Magnant,  Charles L, PA-C  celecoxib (CELEBREX) 200 MG capsule Take 1 capsule (200 mg total) by mouth 2 (two) times daily. 06/12/20   Ladajah Soltys, MD  metFORMIN (GLUCOPHAGE) 500 MG tablet Take 1 tablet (500 mg total) by mouth 2 (two) times daily with a meal. 07/10/19   Orland Mustard, MD  rosuvastatin (CRESTOR) 10 MG tablet Take 1 tablet (10 mg total) by mouth daily. 07/10/19   Orland Mustard, MD  valsartan-hydrochlorothiazide (DIOVAN HCT) 160-25 MG tablet Take 1 tablet by mouth daily. 08/17/19   Orland Mustard, MD    Allergies Patient has no known allergies.   REVIEW OF SYSTEMS  Negative except as noted here or in the History of Present Illness.   PHYSICAL EXAMINATION  Initial Vital Signs Blood pressure (!) 199/106, pulse 80, temperature 98.3 F (36.8 C), resp. rate 18, SpO2 95 %.  Examination General: Well-developed, well-nourished male in no acute distress; appearance consistent with age of record HENT: normocephalic; atraumatic Eyes: Normal appearance Neck: supple Heart: regular rate and rhythm Lungs: clear to auscultation bilaterally Abdomen: soft; nondistended; nontender; bowel sounds present Extremities: No deformity; pulses normal; tenderness of knees bilaterally, notably along the medial aspect of the right knee, slight effusions palpated bilaterally Neurologic: Awake, alert and oriented; motor function intact in all extremities and symmetric; no facial droop Skin: Warm and dry Psychiatric: Normal mood and affect   RESULTS  Summary  of this visit's results, reviewed and interpreted by myself:   EKG Interpretation  Date/Time:    Ventricular Rate:    PR Interval:    QRS Duration:   QT Interval:    QTC Calculation:   R Axis:     Text Interpretation:        Laboratory Studies: No results found for this or any previous visit (from the past 24 hour(s)). Imaging Studies: No results found.  ED COURSE and MDM  Nursing notes, initial and subsequent vitals signs, including  pulse oximetry, reviewed and interpreted by myself.  Vitals:   06/12/20 0105  BP: (!) 199/106  Pulse: 80  Resp: 18  Temp: 98.3 F (36.8 C)  SpO2: 95%   Medications - No data to display  We will refer to Dr. Jordan Likes of sports medicine for follow-up.  In the meantime we will send blood for rheumatoid factor and anti-CCP antibodies.  We will trial Celebrex as it may work better than the Aleve.  PROCEDURES  Procedures   ED DIAGNOSES     ICD-10-CM   1. Bilateral chronic knee pain  M25.561    M25.562    G89.29        Saleena Tamas, Jonny Ruiz, MD 06/12/20 [phone]

## 2020-06-12 NOTE — ED Notes (Signed)
Attempted blood draw twice, unsuccessful; pt tolerated well

## 2020-06-12 NOTE — ED Triage Notes (Signed)
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 States he was supposed to see specialist but has unable to. Ambulatory.

## 2020-06-13 ENCOUNTER — Telehealth: Payer: Self-pay | Admitting: Family Medicine

## 2020-06-13 LAB — RHEUMATOID FACTOR: Rheumatoid fact SerPl-aCnc: <10 IU/mL (ref 0.0–13.9)

## 2020-06-13 LAB — CYCLIC CITRUL PEPTIDE ANTIBODY, IGG/IGA: CCP Antibodies IgG/IgA: 15 units (ref 0–19)

## 2020-06-13 NOTE — Telephone Encounter (Signed)
Called pt to offer ED follow-up appt for knee pain, no answer & unable to leave msg.--will cb later to see if pt still needs appt w/ Dr. Jordan Likes.  --glh

## 2020-06-14 ENCOUNTER — Ambulatory Visit: Payer: Self-pay

## 2020-06-14 ENCOUNTER — Encounter: Payer: Self-pay | Admitting: Family Medicine

## 2020-06-14 ENCOUNTER — Ambulatory Visit (INDEPENDENT_AMBULATORY_CARE_PROVIDER_SITE_OTHER): Payer: Self-pay | Admitting: Family Medicine

## 2020-06-14 ENCOUNTER — Other Ambulatory Visit: Payer: Self-pay

## 2020-06-14 VITALS — BP 164/92 | HR 96 | Ht 68.0 in | Wt 235.0 lb

## 2020-06-14 DIAGNOSIS — M84453A Pathological fracture, unspecified femur, initial encounter for fracture: Secondary | ICD-10-CM

## 2020-06-14 DIAGNOSIS — M25562 Pain in left knee: Secondary | ICD-10-CM

## 2020-06-14 NOTE — Progress Notes (Addendum)
Donald Cole - 59 y.o. male MRN [id number]  Date of birth: Jun 21, 1961  SUBJECTIVE:  Including CC & ROS.  Chief Complaint  Patient presents with   Knee Pain    bilateral    Donald Cole is a 59 y.o. male that is presenting with right left knee pain.  This is acute in nature.  No history of injury or inciting event.  Is occurring over the medial joint space in each knee.  It is worse with walking.  Does feel throbbing at night.  No improvement with modalities today..  Independent review of the left knee x-ray from 6/17 shows no significant changes.   Review of Systems See HPI   HISTORY: Past Medical, Surgical, Social, and Family History Reviewed & Updated per EMR.   Pertinent Historical Findings include:  Past Medical History:  Diagnosis Date   Diabetes mellitus without complication (HCC)    Hypercholesterolemia    Hypertension    non compliant with amlodipine   Pre-diabetes    Rupture of UCL of right thumb     Past Surgical History:  Procedure Laterality Date   LIGAMENT REPAIR Right 11/14/2018   Procedure: RIGHT THUMB ULNAR COLLATERAL LIGAMENT REPAIR;  Surgeon: Dairl Ponder, MD;  [facility] ;  Service: Orthopedics;  Laterality: Right;    Family History  Problem Relation Age of Onset   Cancer Mother     Social History   Socioeconomic History   Marital status: Single    Spouse name: Not on file   Number of children: Not on file   Years of education: Not on file   Highest education level: Not on file  Occupational History   Not on file  Tobacco Use   Smoking status: Never Smoker   Smokeless tobacco: Never Used  Vaping Use   Vaping Use: Never used  Substance and Sexual Activity   Alcohol use: No   Drug use: No   Sexual activity: Yes    Birth control/protection: None  Other Topics Concern   Not on file  Social History Narrative   Not on file   Social Determinants of Health   Financial Resource  Strain:    Difficulty of Paying Living Expenses: Not on file  Food Insecurity:    Worried About Running Out of Food in the Last Year: Not on file   [employer] of Food in the Last Year: Not on file  Transportation Needs:    Lack of Transportation (Medical): Not on file   Lack of Transportation (Non-Medical): Not on file  Physical Activity:    Days of Exercise per Week: Not on file   Minutes of Exercise per Session: Not on file  Stress:    Feeling of Stress : Not on file  Social Connections:    Frequency of Communication with Friends and Family: Not on file   Frequency of Social Gatherings with Friends and Family: Not on file   Attends Religious Services: Not on file   Active Member of Clubs or Organizations: Not on file   Attends Banker Meetings: Not on file   Marital Status: Not on file  Intimate Partner Violence:    Fear of Current or Ex-Partner: Not on file   Emotionally Abused: Not on file   Physically Abused: Not on file   Sexually Abused: Not on file     PHYSICAL EXAM:  VS: BP (!) 164/92    Pulse 96    Ht 5\' 8"  (1.727 m)  Wt 235 lb (106.6 kg)    BMI 35.73 kg/m  Physical Exam Gen: NAD, alert, cooperative with exam, well-appearing MSK:  Left and right knee: No obvious effusion. Normal range of motion. Some tenderness palpation of the medial joint space. Normal strength resistance. Neurovascular intact  Limited ultrasound: Right knee, left knee:  Left knee: Moderate effusion suprapatellar pouch. Normal-appearing quadricep patellar tendon. Degenerative changes appreciated the medial meniscus..   Increased hyperemia over the medial femoral condyle to suggest insufficiency fracture.  Right knee: Mild effusion. Normal-appearing quadricep patellar tendon. Degenerative changes of the medial meniscus. Hyperemia overlying the medial femoral condyle but less active compared to the contralateral side.  Summary: He appears to have  insufficiency fracture bony stress of the medial femoral condyle bilaterally.  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  The left seems worse than the right. -Counseled on home exercise therapy and supportive care. -Counseled on calcium and vitamin D. -Counseled on partial weightbearing. -Rollator. -Could consider physical therapy for injection.

## 2020-06-14 NOTE — Patient Instructions (Signed)
Nice to meet you Please try to limit the weight bearing on the knees  Please use ice  Please try the exercises   Please send me a message in MyChart with any questions or updates.  Please see me back in 3 weeks.   --Dr. Jordan Likes

## 2020-06-15 DIAGNOSIS — M958 Other specified acquired deformities of musculoskeletal system: Secondary | ICD-10-CM | POA: Insufficient documentation

## 2020-06-15 NOTE — Assessment & Plan Note (Signed)
He has changes over the medial femoral condyle bilaterally.  The left seems worse than the right. -Counseled on home exercise therapy and supportive care. -Counseled on calcium and vitamin D. -Counseled on partial weightbearing. -Rollator. -Could consider physical therapy for injection.

## 2020-06-26 ENCOUNTER — Other Ambulatory Visit: Payer: Self-pay

## 2020-06-26 ENCOUNTER — Emergency Department (HOSPITAL_COMMUNITY): Payer: Self-pay

## 2020-06-26 ENCOUNTER — Encounter (HOSPITAL_COMMUNITY): Payer: Self-pay | Admitting: Emergency Medicine

## 2020-06-26 ENCOUNTER — Emergency Department (HOSPITAL_COMMUNITY)
Admission: EM | Admit: 2020-06-26 | Discharge: 2020-06-26 | Disposition: A | Discharge status: Home / Self Care | Payer: Self-pay | Attending: Emergency Medicine | Admitting: Emergency Medicine

## 2020-06-26 DIAGNOSIS — Z7984 Long term (current) use of oral hypoglycemic drugs: Secondary | ICD-10-CM | POA: Insufficient documentation

## 2020-06-26 DIAGNOSIS — X509XXA Other and unspecified overexertion or strenuous movements or postures, initial encounter: Secondary | ICD-10-CM | POA: Insufficient documentation

## 2020-06-26 DIAGNOSIS — I10 Essential (primary) hypertension: Secondary | ICD-10-CM

## 2020-06-26 DIAGNOSIS — Z79899 Other long term (current) drug therapy: Secondary | ICD-10-CM | POA: Insufficient documentation

## 2020-06-26 DIAGNOSIS — E119 Type 2 diabetes mellitus without complications: Secondary | ICD-10-CM | POA: Insufficient documentation

## 2020-06-26 DIAGNOSIS — Z9114 Patient's other noncompliance with medication regimen: Secondary | ICD-10-CM

## 2020-06-26 DIAGNOSIS — Z91148 Patient's other noncompliance with medication regimen for other reason: Secondary | ICD-10-CM

## 2020-06-26 DIAGNOSIS — S299XXA Unspecified injury of thorax, initial encounter: Secondary | ICD-10-CM

## 2020-06-26 MED ORDER — IRBESARTAN 150 MG PO TABS
150.0000 mg | ORAL_TABLET | Freq: Every day | ORAL | Status: DC
  Administered 2020-06-26: 150 mg via ORAL
  Filled 2020-06-26: qty 1

## 2020-06-26 MED ORDER — HYDROCHLOROTHIAZIDE 12.5 MG PO CAPS
25.0000 mg | ORAL_CAPSULE | Freq: Once | ORAL | Status: AC
  Administered 2020-06-26: 25 mg via ORAL
  Filled 2020-06-26: qty 2

## 2020-06-26 MED ORDER — VALSARTAN-HYDROCHLOROTHIAZIDE 160-25 MG PO TABS
1.0000 | ORAL_TABLET | Freq: Every day | ORAL | 1 refills | Status: DC
Start: 2020-06-26 — End: 2020-11-23

## 2020-06-26 MED ORDER — HYDROCODONE-ACETAMINOPHEN 5-325 MG PO TABS: 1.0000 | ORAL_TABLET | ORAL | 0 refills | Status: DC | PRN

## 2020-06-26 NOTE — ED Triage Notes (Signed)
Pt reports that he was dry heaving last night and felt a pop in his R flank under his ribs. Reports pain in that area since and has not been able to get comfortable. A&Ox4. Very hypertensive in triage.

## 2020-06-26 NOTE — ED Provider Notes (Addendum)
[facility]-EMERGENCY DEPT Provider Note: Lowella Dell, MD, FACEP  CSN: [id number] MRN: [id number] ARRIVAL: 06/26/20 at 0017 ROOM: WA17/WA17   CHIEF COMPLAINT  Rib Injury   HISTORY OF PRESENT ILLNESS  06/26/20 12:49 AM CLARE CASTO is a 59 y.o. male who felt some acid reflux yesterday morning about 4:30 AM and had an episode of retching.  While retching he felt the sudden onset of a "pop" in his right lower rib area.  He is now having a sharp pain in his right lower ribs about the right mid axillary line.  Pain is worse with coughing or certain movements.  He rates it as a 7 out of 10.  He has a history of hypertension but admits he did not take his blood pressure medications yesterday and was noted to be hypertensive in triage.   Past Medical History:  Diagnosis Date  . Diabetes mellitus without complication (HCC)   . Hypercholesterolemia   . Hypertension    non compliant with amlodipine  . Pre-diabetes   . Rupture of UCL of right thumb     Past Surgical History:  Procedure Laterality Date  . LIGAMENT REPAIR Right 11/14/2018   Procedure: RIGHT THUMB ULNAR COLLATERAL LIGAMENT REPAIR;  Surgeon: Dairl Ponder, MD;  Location: [facility];  Service: Orthopedics;  Laterality: Right;    Family History  Problem Relation Age of Onset  . Cancer Mother     Social History   Tobacco Use  . Smoking status: Never Smoker  . Smokeless tobacco: Never Used  Vaping Use  . Vaping Use: Never used  Substance Use Topics  . Alcohol use: No  . Drug use: No    Prior to Admission medications   Medication Sig Start Date End Date Taking? Authorizing Provider  HYDROcodone-acetaminophen (NORCO) 5-325 MG tablet Take 1 tablet by mouth every 4 (four) hours as needed (for rib pain). 06/26/20   Nicasio Barlowe, MD  valsartan-hydrochlorothiazide (DIOVAN HCT) 160-25 MG tablet Take 1 tablet by mouth daily. 06/26/20   Reyes Aldaco, MD  metFORMIN (GLUCOPHAGE) 500 MG tablet Take 1 tablet  (500 mg total) by mouth 2 (two) times daily with a meal. Patient not taking: Reported on 06/26/2020 07/10/19 06/26/20  Orland Mustard, MD  rosuvastatin (CRESTOR) 10 MG tablet Take 1 tablet (10 mg total) by mouth daily. Patient not taking: Reported on 06/26/2020 07/10/19 06/26/20  Orland Mustard, MD    Allergies Patient has no known allergies.   REVIEW OF SYSTEMS  Negative except as noted here or in the History of Present Illness.   PHYSICAL EXAMINATION  Initial Vital Signs Blood pressure (!) 204/119, pulse 82, temperature 98.4 F (36.9 C), temperature source Oral, resp. rate 18, height 5\' 8"  (1.727 m), weight 106.6 kg, SpO2 97 %.  Examination General: Well-developed, well-nourished male in no acute distress; appearance consistent with age of record HENT: normocephalic; atraumatic Eyes: pupils equal, round and reactive to light; extraocular muscles intact Neck: supple Heart: regular rate and rhythm Lungs: clear to auscultation bilaterally Chest: Point tenderness at base of right rib cage along the right mid axillary line Abdomen: soft; nondistended; nontender; bowel sounds present Extremities: No deformity; full range of motion; pulses normal Neurologic: Awake, alert and oriented; motor function intact in all extremities and symmetric; no facial droop Skin: Warm and dry Psychiatric: Normal mood and affect   RESULTS  Summary of this visit's results, reviewed and interpreted by myself:   EKG Interpretation  Date/Time:  Sunday June 26 2020 00:39:44 EDT  Ventricular Rate:  70 PR Interval:    QRS Duration: 79 QT Interval:  368 QTC Calculation: 397 R Axis:   -11 Text Interpretation: Sinus rhythm Prolonged PR interval Probable left atrial enlargement Anterior infarct, old Confirmed by Micole Delehanty ([id number]) on 06/26/2020 12:48:20 AM      Laboratory Studies: No results found for this or any previous visit (from the past 24 hour(s)). Imaging Studies: DG Ribs Unilateral  W/Chest Right  Result Date: 06/26/2020 CLINICAL DATA:  Rib injury, emesis.  Heard crack. EXAM: RIGHT RIBS AND CHEST - 3+ VIEW COMPARISON:  Chest x-ray 05/20/2018. FINDINGS: No fracture or other bone lesions are seen involving the ribs. The heart size and mediastinal contours are within normal limits. No focal consolidation. No pulmonary edema. No pleural effusion. No pneumothorax. No acute osseous abnormality.  Degenerative changes of the spine. IMPRESSION: 1. No acute displaced right rib fracture. 2. No acute cardiopulmonary abnormality. Electronically Signed   By: Tish Frederickson M.D.   On: 06/26/2020 01:31    ED COURSE and MDM  Nursing notes, initial and subsequent vitals signs, including pulse oximetry, reviewed and interpreted by myself.  Vitals:   06/26/20 0029 06/26/20 0138  BP: (!) 204/119 (!) 196/106  Pulse: 82 71  Resp: 18 17  Temp: 98.4 F (36.9 C)   TempSrc: Oral   SpO2: 97% 99%  Weight: 106.6 kg   Height: 5\' 8"  (1.727 m)    Medications  irbesartan (AVAPRO) tablet 150 mg (150 mg Oral Given 06/26/20 0134)  hydrochlorothiazide (MICROZIDE) capsule 25 mg (25 mg Oral Given 06/26/20 0134)   No rib fracture seen on plain films but injuries to the left lower ribs are often cartilaginous injuries.  We will treat with a brief course of analgesics.  Patient given dose of irbesartan and hydrochlorothiazide in ED and his Diovan HCT refilled.  The patient's PCP, Dr. 06/28/20, has already been advised of the patient's recent visits to the ED and suspected medication noncompliance.  PROCEDURES  Procedures   ED DIAGNOSES     ICD-10-CM   1. Rib injury  S29.9XXA   2. Hypertension not at goal  I10   3. Noncompliance with medication regimen  Z91.14        Kodie Pick, Lowella Fairy, MD 06/26/20 0148    06/28/20, MD 06/26/20 0201

## 2020-07-06 ENCOUNTER — Ambulatory Visit (INDEPENDENT_AMBULATORY_CARE_PROVIDER_SITE_OTHER): Payer: Self-pay | Admitting: Family Medicine

## 2020-07-06 ENCOUNTER — Other Ambulatory Visit: Payer: Self-pay

## 2020-07-06 ENCOUNTER — Encounter: Payer: Self-pay | Admitting: Family Medicine

## 2020-07-06 VITALS — BP 185/116 | HR 68 | Ht 68.0 in | Wt 235.0 lb

## 2020-07-06 DIAGNOSIS — M84453A Pathological fracture, unspecified femur, initial encounter for fracture: Secondary | ICD-10-CM

## 2020-07-06 NOTE — Patient Instructions (Signed)
Good to see you Please use ice as needed   Please send me a message in MyChart with any questions or updates.  We will setup a virtual visit once the MRI is resulted.   --Dr. Jordan Likes

## 2020-07-06 NOTE — Assessment & Plan Note (Signed)
His pain is still significant in nature.  Has been ongoing for several months.  Has tried injection therapy in each knee with limited improvement.  Imaging of each knee has not showed any significant changes.  Ultrasound was more suggestive of a insufficiency fracture of each medial femoral condyle. -Counseled on home exercise therapy and supportive care. -MRI of the left and right knee to evaluate for insufficiency fracture. -Could consider gel injections.

## 2020-07-06 NOTE — Progress Notes (Signed)
Donald Cole - 59 y.o. male MRN [id number]  Date of birth: [birth year]  SUBJECTIVE:  Including CC & ROS.  Chief Complaint  Patient presents with  . Follow-up    bilateral knee    Donald Cole is a 59 y.o. male that is following up for his bilateral knee pain.  He has had injections as well as tried medications with no improvement of his pain.  He has tried using the rolling walker and limit his weightbearing.  His pain is still occurring in each knee significantly.  Review of Systems See HPI   HISTORY: Past Medical, Surgical, Social, and Family History Reviewed & Updated per EMR.   Pertinent Historical Findings include:  Past Medical History:  Diagnosis Date  . Diabetes mellitus without complication (HCC)   . Hypercholesterolemia   . Hypertension    non compliant with amlodipine  . Pre-diabetes   . Rupture of UCL of right thumb     Past Surgical History:  Procedure Laterality Date  . LIGAMENT REPAIR Right 11/14/2018   Procedure: RIGHT THUMB ULNAR COLLATERAL LIGAMENT REPAIR;  Surgeon: Dairl Ponder, MD;  Location: [facility];  Service: Orthopedics;  Laterality: Right;    Family History  Problem Relation Age of Onset  . Cancer Mother     Social History   Socioeconomic History  . Marital status: Single    Spouse name: Not on file  . Number of children: Not on file  . Years of education: Not on file  . Highest education level: Not on file  Occupational History  . Not on file  Tobacco Use  . Smoking status: Never Smoker  . Smokeless tobacco: Never Used  Vaping Use  . Vaping Use: Never used  Substance and Sexual Activity  . Alcohol use: No  . Drug use: No  . Sexual activity: Yes    Birth control/protection: None  Other Topics Concern  . Not on file  Social History Narrative  . Not on file   Social Determinants of Health   Financial Resource Strain:   . Difficulty of Paying Living Expenses: Not on file  Food Insecurity:   .  Worried About Programme researcher, broadcasting/film/video in the Last Year: Not on file  . Ran Out of Food in the Last Year: Not on file  Transportation Needs:   . Lack of Transportation (Medical): Not on file  . Lack of Transportation (Non-Medical): Not on file  Physical Activity:   . Days of Exercise per Week: Not on file  . Minutes of Exercise per Session: Not on file  Stress:   . Feeling of Stress : Not on file  Social Connections:   . Frequency of Communication with Friends and Family: Not on file  . Frequency of Social Gatherings with Friends and Family: Not on file  . Attends Religious Services: Not on file  . Active Member of Clubs or Organizations: Not on file  . Attends Banker Meetings: Not on file  . Marital Status: Not on file  Intimate Partner Violence:   . Fear of Current or Ex-Partner: Not on file  . Emotionally Abused: Not on file  . Physically Abused: Not on file  . Sexually Abused: Not on file     PHYSICAL EXAM:  VS: BP (!) 185/116   Pulse 68   Ht 5\' 8"  (1.727 m)   Wt 235 lb (106.6 kg)   BMI 35.73 kg/m  Physical Exam Gen: NAD, alert, cooperative with exam,  well-appearing MSK:  Right and left knee: No obvious effusion. Normal range of motion. Tenderness palpation of the medial femoral condyle and joint space. 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  Has been ongoing for several months.  Has tried injection therapy in each knee with limited improvement.  Imaging of each knee has not showed any significant changes.  Ultrasound was more suggestive of a insufficiency fracture of each medial femoral condyle. -Counseled on home exercise therapy and supportive care. -MRI of the left and right knee to evaluate for insufficiency fracture. -Could consider gel injections.

## 2020-09-25 ENCOUNTER — Encounter (HOSPITAL_BASED_OUTPATIENT_CLINIC_OR_DEPARTMENT_OTHER): Payer: Self-pay | Admitting: Emergency Medicine

## 2020-09-25 ENCOUNTER — Other Ambulatory Visit: Payer: Self-pay

## 2020-09-25 ENCOUNTER — Emergency Department (HOSPITAL_BASED_OUTPATIENT_CLINIC_OR_DEPARTMENT_OTHER)
Admission: EM | Admit: 2020-09-25 | Discharge: 2020-09-25 | Disposition: A | Discharge status: Home / Self Care | Payer: 59 | Attending: Emergency Medicine | Admitting: Emergency Medicine

## 2020-09-25 DIAGNOSIS — I10 Essential (primary) hypertension: Secondary | ICD-10-CM | POA: Diagnosis not present

## 2020-09-25 DIAGNOSIS — E119 Type 2 diabetes mellitus without complications: Secondary | ICD-10-CM | POA: Insufficient documentation

## 2020-09-25 DIAGNOSIS — Z7984 Long term (current) use of oral hypoglycemic drugs: Secondary | ICD-10-CM | POA: Diagnosis not present

## 2020-09-25 DIAGNOSIS — Z20822 Contact with and (suspected) exposure to covid-19: Secondary | ICD-10-CM | POA: Diagnosis not present

## 2020-09-25 DIAGNOSIS — J069 Acute upper respiratory infection, unspecified: Secondary | ICD-10-CM | POA: Diagnosis not present

## 2020-09-25 DIAGNOSIS — Z79899 Other long term (current) drug therapy: Secondary | ICD-10-CM | POA: Insufficient documentation

## 2020-09-25 DIAGNOSIS — R059 Cough, unspecified: Secondary | ICD-10-CM | POA: Diagnosis present

## 2020-09-25 MED ORDER — IRBESARTAN 150 MG PO TABS
150.0000 mg | ORAL_TABLET | Freq: Every day | ORAL | Status: DC
  Administered 2020-09-25: 150 mg via ORAL
  Filled 2020-09-25: qty 1

## 2020-09-25 MED ORDER — HYDROCHLOROTHIAZIDE 25 MG PO TABS
25.0000 mg | ORAL_TABLET | Freq: Once | ORAL | Status: AC
  Administered 2020-09-25: 25 mg via ORAL
  Filled 2020-09-25: qty 1

## 2020-09-25 MED ORDER — AEROCHAMBER PLUS FLO-VU LARGE MISC
1.0000 | Freq: Once | Status: DC
  Filled 2020-09-25: qty 1

## 2020-09-25 MED ORDER — ALBUTEROL SULFATE HFA 108 (90 BASE) MCG/ACT IN AERS
6.0000 | INHALATION_SPRAY | Freq: Once | RESPIRATORY_TRACT | Status: AC
  Administered 2020-09-25: 6 via RESPIRATORY_TRACT
  Filled 2020-09-25: qty 6.7

## 2020-09-25 NOTE — ED Triage Notes (Signed)
Concerned for bronchitis, reports coughing/wheezing. Onset yesterday. States he doesn't have any inhalers.

## 2020-09-25 NOTE — Discharge Instructions (Addendum)
Home to quarantine pending COVID test results. Follow up with your doctor. Return to the ER for worsening or concerning symptoms. Use inhaler 1-2 puffs every 4-6 hours as needed for cough and wheezing.  Take your blood pressure medication as prescribed. Take Coricidin HBP as needed as directed for symptom relief.   If your COVID test is positive, call your doctor to discuss if you are a candidate for COVID antiviral medications or infusion.

## 2020-09-25 NOTE — ED Notes (Signed)
Pt c/o "heart racing" after albuterol. Pt HR 60.

## 2020-09-25 NOTE — ED Provider Notes (Signed)
[facility] EMERGENCY DEPARTMENT Provider Note   CSN: [id number] Arrival date & time: 09/25/20  2126     History Chief Complaint  Patient presents with  . Shortness of Breath    Donald Cole is a 60 y.o. male.  60 year old male with history of HTN, DM, hyperlipidemia, obesity presents with complaint of cough, congestion, wheezing, shortness of breath onset yesterday. Patient reports history of acute bronchitis in the past, has had inhalers, does not have an inhaler currently and able to find primatine mist which he usually uses. Patient is a non smoker, not vaccinated against COVID. No known sick contacts. Denies CP, lower extremity edema, has not been taking BP pills although does have them at home.         Past Medical History:  Diagnosis Date  . Diabetes mellitus without complication (HCC)   . Hypercholesterolemia   . Hypertension    non compliant with amlodipine  . Pre-diabetes   . Rupture of UCL of right thumb     Patient Active Problem List   Diagnosis Date Noted  . 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 Grade I diastolic dysfunction 07/16/2019  . Hyperlipidemia associated with type 2 diabetes mellitus (HCC) 07/10/2019  . Diabetes mellitus without complication (HCC) 07/08/2019  . Benign essential HTN 07/08/2019  . Gamekeeper's thumb of right hand 11/11/2018    Past Surgical History:  Procedure Laterality Date  . LIGAMENT REPAIR Right 11/14/2018   Procedure: RIGHT THUMB ULNAR COLLATERAL LIGAMENT REPAIR;  Surgeon: Dairl Ponder, MD;  Location: [facility];  Service: Orthopedics;  Laterality: Right;       Family History  Problem Relation Age of Onset  . Cancer Mother     Social History   Tobacco Use  . Smoking status: Never Smoker  . Smokeless tobacco: Never Used  Vaping Use  . Vaping Use: Never used  Substance Use Topics  . Alcohol use: No  . Drug use: No    Home Medications Prior  to Admission medications   Medication Sig Start Date End Date Taking? Authorizing Provider  HYDROcodone-acetaminophen (NORCO) 5-325 MG tablet Take 1 tablet by mouth every 4 (four) hours as needed (for rib pain). 06/26/20   Molpus, John, MD  valsartan-hydrochlorothiazide (DIOVAN HCT) 160-25 MG tablet Take 1 tablet by mouth daily. 06/26/20   Molpus, John, MD  metFORMIN (GLUCOPHAGE) 500 MG tablet Take 1 tablet (500 mg total) by mouth 2 (two) times daily with a meal. Patient not taking: Reported on 06/26/2020 07/10/19 06/26/20  Orland Mustard, MD  rosuvastatin (CRESTOR) 10 MG tablet Take 1 tablet (10 mg total) by mouth daily. Patient not taking: Reported on 06/26/2020 07/10/19 06/26/20  Orland Mustard, MD    Allergies    Tramadol  Review of Systems   Review of Systems  Constitutional: Negative for chills and fever.  HENT: Positive for congestion.   Respiratory: Positive for cough, shortness of breath and wheezing.   Cardiovascular: Negative for chest pain and leg swelling.  Gastrointestinal: Negative for diarrhea and vomiting.  Musculoskeletal: Negative for arthralgias and myalgias.  Skin: Negative for rash.  Allergic/Immunologic: Positive for immunocompromised state.  Neurological: Negative for weakness.  Hematological: Negative for adenopathy.  All other systems reviewed and are negative.   Physical Exam Updated Vital Signs BP (!) 187/99 (BP Location: Left Arm)   Pulse 60   Temp 98.6 F (37 C) (Oral)   Resp 20   Ht 5\' 8"  (1.727 m)  Wt 106.1 kg   SpO2 96%   BMI 35.56 kg/m   Physical Exam Vitals and nursing note reviewed.  Constitutional:      General: He is not in acute distress.    Appearance: He is well-developed and well-nourished. He is not diaphoretic.  HENT:     Head: Normocephalic and atraumatic.  Cardiovascular:     Rate and Rhythm: Normal rate and regular rhythm.  Pulmonary:     Effort: Pulmonary effort is normal.     Breath sounds: Wheezing present.   Musculoskeletal:     Right lower leg: No edema.     Left lower leg: No edema.  Skin:    General: Skin is warm and dry.  Neurological:     Mental Status: He is alert and oriented to person, place, and time.  Psychiatric:        Mood and Affect: Mood and affect normal.        Behavior: Behavior normal.     ED Results / Procedures / Treatments   Labs (all labs ordered are listed, but only abnormal results are displayed) Labs Reviewed  SARS CORONAVIRUS 2 (TAT 6-24 HRS)    EKG None  Radiology No results found.  Procedures Procedures (including critical care time)  Medications Ordered in ED Medications  AeroChamber Plus Flo-Vu Large MISC 1 each (has no administration in time range)  irbesartan (AVAPRO) tablet 150 mg (150 mg Oral Given 09/25/20 2153)  albuterol (VENTOLIN HFA) 108 (90 Base) MCG/ACT inhaler 6 puff (6 puffs Inhalation Given 09/25/20 2200)  hydrochlorothiazide (HYDRODIURIL) tablet 25 mg (25 mg Oral Given 09/25/20 2153)    ED Course  I have reviewed the triage vital signs and the nursing notes.  Pertinent labs & imaging results that were available during my care of the patient were reviewed by me and considered in my medical decision making (see chart for details).  Clinical Course as of 09/25/20 2229  Sun Sep 25, 2020  471 60 year old male with complaint of viral URI with wheezing and SHOB. On exam, well appearing, diffuse wheezing, O2 97% on room air. Patient is hypertensive, not compliant with medications (has a chronic knee pain problem and not wanting to take meds currently).  Plan is for albuterol inhaler, COVID test, given dose of BP meds. Plan is to dc with inhaler, recommend quarantine pending COVID test. Follow up with PCP, take BP meds, return to ER as needed. [LM]  2228 Repeat lung exam with improvement in wheezing. Discussed importance on med compliance and if COVID negative, consider COVID vaccine, discussed comorbidities that give concern for poor  outcome.   [LM]    Clinical Course User Index [LM] Alden Hipp   MDM Rules/Calculators/A&P                          Final Clinical Impression(s) / ED Diagnoses Final diagnoses:  Viral URI with cough  Hypertension, unspecified type    Rx / DC Orders ED Discharge Orders    None       Jeannie Fend, PA-C 09/25/20 2229    Derwood Kaplan, MD 09/26/20 1621

## 2020-09-26 LAB — SARS CORONAVIRUS 2 (TAT 6-24 HRS): SARS Coronavirus 2: NEGATIVE

## 2020-10-05 ENCOUNTER — Telehealth: Payer: Self-pay | Admitting: Family Medicine

## 2020-10-05 NOTE — Telephone Encounter (Signed)
Disregard--glh

## 2020-10-22 ENCOUNTER — Ambulatory Visit
Admission: RE | Admit: 2020-10-22 | Discharge: 2020-10-22 | Disposition: A | Discharge status: Home / Self Care | Payer: 59 | Source: Ambulatory Visit | Attending: Family Medicine | Admitting: Family Medicine

## 2020-10-22 ENCOUNTER — Other Ambulatory Visit: Payer: Self-pay

## 2020-10-22 DIAGNOSIS — M84453A Pathological fracture, unspecified femur, initial encounter for fracture: Secondary | ICD-10-CM

## 2020-10-24 ENCOUNTER — Telehealth: Payer: Self-pay | Admitting: Family Medicine

## 2020-10-24 NOTE — Telephone Encounter (Signed)
Called pt to scheduled MRI results review Virtual apptm-- no answer, unable to leave msg-will call back. --glh

## 2020-10-25 ENCOUNTER — Other Ambulatory Visit: Payer: Self-pay

## 2020-10-25 ENCOUNTER — Telehealth (INDEPENDENT_AMBULATORY_CARE_PROVIDER_SITE_OTHER): Payer: 59 | Admitting: Family Medicine

## 2020-10-25 DIAGNOSIS — M958 Other specified acquired deformities of musculoskeletal system: Secondary | ICD-10-CM | POA: Diagnosis not present

## 2020-10-25 NOTE — Patient Instructions (Signed)
Patient is scheduled to see Dr. Jerl Santos at [facility] for 11/07/20 @ 3:00 pm. The address is [address] [place] ph # [phone]. They are only allowing the patient in the office at this time. Please have patient bring photo ID and insurance card and arrive 10-15 min early.

## 2020-10-25 NOTE — Assessment & Plan Note (Signed)
Both knees are demonstrating this change. No trauma. Symptoms have been ongoing for a few months now. Has tried steroid injection but only improvement for about one week.  - counseled on home exercise therapy and supportive care - referral to ortho  - could consider custom medial unloader brace.

## 2020-10-25 NOTE — Progress Notes (Signed)
Virtual Visit via Telephone Note  I connected with [name] on 10/25/20 at  1:20 PM EST by telephone and verified that I am speaking with the correct person using two identifiers.  Location: Patient: vehicle Provider: office   I discussed the limitations, risks, security and privacy concerns of performing an evaluation and management service by telephone and the availability of in person appointments. I also discussed with the patient that there may be a patient responsible charge related to this service. The patient expressed understanding and agreed to proceed.   History of Present Illness:  Mr. Can is a 60 year old male that is following up after the MRI of his right and left knee.  This was demonstrating significant effusion as well as synovitis and an osteochondral defect of the right and left knee.  There is a loose body as well evident.  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 No trauma. Symptoms have been ongoing for a few months now. Has tried steroid injection but only improvement for about one week.  - counseled on home exercise therapy and supportive care - referral to ortho  - could consider custom medial unloader brace.   Follow Up Instructions:    I discussed the assessment and treatment plan with the patient. The patient was provided an opportunity to ask questions and all were answered. The patient agreed with the plan and demonstrated an understanding of the instructions.   The patient was advised to call back or seek an in-person evaluation if the symptoms worsen or if the condition fails to improve as anticipated.  I provided 8 minutes of non-face-to-face time during this encounter.   Clare Gandy, MD

## 2020-11-01 ENCOUNTER — Encounter: Payer: Self-pay | Admitting: *Deleted

## 2020-11-07 ENCOUNTER — Other Ambulatory Visit: Payer: Self-pay | Admitting: Orthopaedic Surgery

## 2020-11-07 DIAGNOSIS — Z01818 Encounter for other preprocedural examination: Secondary | ICD-10-CM

## 2020-11-09 ENCOUNTER — Telehealth: Payer: Self-pay

## 2020-11-09 NOTE — Telephone Encounter (Signed)
Please schedule an annual/Pre Op exam for pt. Pt has not been seen in office since 08/17/2019.   Thank You

## 2020-11-10 NOTE — Telephone Encounter (Signed)
Patient is scheduled for next Friday at 11am.

## 2020-11-12 ENCOUNTER — Encounter (HOSPITAL_BASED_OUTPATIENT_CLINIC_OR_DEPARTMENT_OTHER): Payer: Self-pay | Admitting: Emergency Medicine

## 2020-11-12 ENCOUNTER — Emergency Department (HOSPITAL_BASED_OUTPATIENT_CLINIC_OR_DEPARTMENT_OTHER)
Admission: EM | Admit: 2020-11-12 | Discharge: 2020-11-12 | Disposition: A | Discharge status: Home / Self Care | Payer: 59 | Attending: Emergency Medicine | Admitting: Emergency Medicine

## 2020-11-12 ENCOUNTER — Other Ambulatory Visit: Payer: Self-pay

## 2020-11-12 DIAGNOSIS — H9202 Otalgia, left ear: Secondary | ICD-10-CM | POA: Diagnosis present

## 2020-11-12 DIAGNOSIS — I889 Nonspecific lymphadenitis, unspecified: Secondary | ICD-10-CM

## 2020-11-12 DIAGNOSIS — I10 Essential (primary) hypertension: Secondary | ICD-10-CM | POA: Insufficient documentation

## 2020-11-12 DIAGNOSIS — Z79899 Other long term (current) drug therapy: Secondary | ICD-10-CM | POA: Insufficient documentation

## 2020-11-12 DIAGNOSIS — E119 Type 2 diabetes mellitus without complications: Secondary | ICD-10-CM | POA: Insufficient documentation

## 2020-11-12 MED ORDER — AMOXICILLIN 500 MG PO CAPS
500.0000 mg | ORAL_CAPSULE | Freq: Once | ORAL | Status: AC
  Administered 2020-11-12: 500 mg via ORAL
  Filled 2020-11-12: qty 1

## 2020-11-12 MED ORDER — AMOXICILLIN 500 MG PO CAPS: 500.0000 mg | ORAL_CAPSULE | Freq: Three times a day (TID) | ORAL | 0 refills | Status: DC

## 2020-11-12 NOTE — ED Provider Notes (Signed)
[facility] EMERGENCY DEPARTMENT Provider Note   CSN: [id number] Arrival date & time: 11/12/20  0006     History Chief Complaint  Patient presents with  . Ear Pain    Donald Cole is a 60 y.o. male.  The history is provided by the patient.  Otalgia Location:  Left Behind ear:  No abnormality Quality:  Aching Severity:  Moderate Onset quality:  Gradual Timing:  Constant Progression:  Unchanged Chronicity:  New Context: not direct blow and not recent URI   Relieved by:  Nothing Worsened by:  Nothing Ineffective treatments:  None tried Associated symptoms: no abdominal pain, no ear discharge, no fever, no rhinorrhea, no tinnitus and no vomiting   Risk factors: no recent travel   Swelling anterior to the left ear.  No f/c/r.  No discharge.  No f/c/r.      Past Medical History:  Diagnosis Date  . Diabetes mellitus without complication (HCC)   . Hypercholesterolemia   . Hypertension    non compliant with amlodipine  . Pre-diabetes   . Rupture of UCL of right thumb     Patient Active Problem List   Diagnosis Date Noted  . Osteochondral defect of femoral condyle 06/15/2020  . Grade I diastolic dysfunction 07/16/2019  . Hyperlipidemia associated with type 2 diabetes mellitus (HCC) 07/10/2019  . Diabetes mellitus without complication (HCC) 07/08/2019  . Benign essential HTN 07/08/2019  . Gamekeeper's thumb of right hand 11/11/2018    Past Surgical History:  Procedure Laterality Date  . LIGAMENT REPAIR Right 11/14/2018   Procedure: RIGHT THUMB ULNAR COLLATERAL LIGAMENT REPAIR;  Surgeon: Dairl Ponder, MD;  Location: [facility] ;  Service: Orthopedics;  Laterality: Right;       Family History  Problem Relation Age of Onset  . Cancer Mother     Social History   Tobacco Use  . Smoking status: Never Smoker  . Smokeless tobacco: Never Used  Vaping Use  . Vaping Use: Never used  Substance Use Topics  . Alcohol use: No  . Drug  use: No    Home Medications Prior to Admission medications   Medication Sig Start Date End Date Taking? Authorizing Provider  valsartan-hydrochlorothiazide (DIOVAN HCT) 160-25 MG tablet Take 1 tablet by mouth daily. 06/26/20   Molpus, John, MD  metFORMIN (GLUCOPHAGE) 500 MG tablet Take 1 tablet (500 mg total) by mouth 2 (two) times daily with a meal. Patient not taking: Reported on 06/26/2020 07/10/19 06/26/20  Orland Mustard, MD  rosuvastatin (CRESTOR) 10 MG tablet Take 1 tablet (10 mg total) by mouth daily. Patient not taking: Reported on 06/26/2020 07/10/19 06/26/20  Orland Mustard, MD    Allergies    Tramadol  Review of Systems   Review of Systems  Constitutional: Negative for fever.  HENT: Positive for ear pain. Negative for drooling, ear discharge, facial swelling, rhinorrhea and tinnitus.   Eyes: Negative for photophobia.  Respiratory: Negative for shortness of breath.   Cardiovascular: Negative for chest pain.  Gastrointestinal: Negative for abdominal pain and vomiting.  Genitourinary: Negative for difficulty urinating.  Musculoskeletal: Negative for arthralgias.  Skin: Negative for color change.  Neurological: Negative for dizziness.  Psychiatric/Behavioral: Negative for agitation.  All other systems reviewed and are negative.   Physical Exam Updated Vital Signs BP (!) 191/88   Pulse 63   Temp 98 F (36.7 C) (Oral)   Resp 18   Ht 5\' 8"  (1.727 m)   Wt 104.3 kg   SpO2 99%  BMI 34.97 kg/m   Physical Exam Vitals and nursing note reviewed.  Constitutional:      General: He is not in acute distress.    Appearance: Normal appearance.  HENT:     Head: Normocephalic and atraumatic.     Left Ear: Tympanic membrane, ear canal and external ear normal.     Ears:     Comments: Preauricular lymph node swelling.  1 cm, not matted no abscessed.  No swelling of the auricle     Nose: Nose normal.  Eyes:     Conjunctiva/sclera: Conjunctivae normal.     Pupils: Pupils  are equal, round, and reactive to light.  Cardiovascular:     Rate and Rhythm: Normal rate and regular rhythm.     Pulses: Normal pulses.     Heart sounds: Normal heart sounds.  Pulmonary:     Effort: Pulmonary effort is normal.     Breath sounds: Normal breath sounds.  Abdominal:     General: Abdomen is flat. Bowel sounds are normal.     Palpations: Abdomen is soft.     Tenderness: There is no abdominal tenderness. There is no guarding.  Musculoskeletal:        General: Normal range of motion.     Cervical back: Normal range of motion and neck supple.  Skin:    General: Skin is warm and dry.     Capillary Refill: Capillary refill takes less than 2 seconds.  Neurological:     General: No focal deficit present.     Mental Status: He is alert and oriented to person, place, and time.     Deep Tendon Reflexes: Reflexes normal.  Psychiatric:        Mood and Affect: Mood normal.        Behavior: Behavior normal.     ED Results / Procedures / Treatments   Labs (all labs ordered are listed, but only abnormal results are displayed) Labs Reviewed - No data to display  EKG None  Radiology No results found.  Procedures Procedures   Medications Ordered in ED Medications  amoxicillin (AMOXIL) capsule 500 mg (500 mg Oral Given 11/12/20 0056)    ED Course  I have reviewed the triage vital signs and the nursing notes.  Pertinent labs & imaging results that were available during my care of the patient were reviewed by me and considered in my medical decision making (see chart for details).    Will treat for lymhadenitis, isolated lymph node swelling. Will start amoxil.  Follow up with your PMD for recheck  [name] was evaluated in Emergency Department on 11/12/2020 for the symptoms described in the history of present illness. He was evaluated in the context of the global COVID-19 pandemic, which necessitated consideration that the patient might be at risk for infection  with the SARS-CoV-2 virus that causes COVID-19. Institutional protocols and algorithms that pertain to the evaluation of patients at risk for COVID-19 are in a state of rapid change based on information released by regulatory bodies including the CDC and federal and state organizations. These policies and algorithms were followed during the patient's care in the ED.   Final Clinical Impression(s) / ED Diagnoses Return for intractable cough, coughing up blood, fevers >100.4 unrelieved by medication, shortness of breath, intractable vomiting, chest pain, shortness of breath, weakness, numbness, changes in speech, facial asymmetry, abdominal pain, passing out, Inability to tolerate liquids or food, cough, altered mental status or any concerns. No signs of systemic  illness or infection. The patient is nontoxic-appearing on exam and vital signs are within normal limits.  I have reviewed the triage vital signs and the nursing notes. Pertinent labs & imaging results that were available during my care of the patient were reviewed by me and considered in my medical decision making (see chart for details). After history, exam, and medical workup I feel the patient has been appropriately medically screened and is safe for discharge home. Pertinent diagnoses were discussed with the patient. Patient was given return precautions.     Roxane Puerto, MD 11/12/20 [phone]

## 2020-11-12 NOTE — ED Notes (Signed)
Pt. Has a large amt of edema noted on the outer side of his L ear at the jaw area and reports a full feeling.  1/10 pain and reports more of a discomfort.

## 2020-11-12 NOTE — ED Triage Notes (Signed)
C/o left ear pain and swelling.

## 2020-11-18 ENCOUNTER — Encounter: Payer: 59 | Admitting: Family Medicine

## 2020-11-23 ENCOUNTER — Other Ambulatory Visit: Payer: Self-pay

## 2020-11-23 ENCOUNTER — Encounter: Payer: Self-pay | Admitting: Family Medicine

## 2020-11-23 ENCOUNTER — Telehealth: Payer: Self-pay | Admitting: Family Medicine

## 2020-11-23 ENCOUNTER — Ambulatory Visit (INDEPENDENT_AMBULATORY_CARE_PROVIDER_SITE_OTHER): Payer: 59 | Admitting: Family Medicine

## 2020-11-23 VITALS — BP 200/110 | HR 68 | Temp 98.2°F | Ht 68.0 in | Wt 239.2 lb

## 2020-11-23 DIAGNOSIS — R350 Frequency of micturition: Secondary | ICD-10-CM

## 2020-11-23 DIAGNOSIS — E1169 Type 2 diabetes mellitus with other specified complication: Secondary | ICD-10-CM

## 2020-11-23 DIAGNOSIS — E119 Type 2 diabetes mellitus without complications: Secondary | ICD-10-CM

## 2020-11-23 DIAGNOSIS — Z0000 Encounter for general adult medical examination without abnormal findings: Secondary | ICD-10-CM | POA: Diagnosis not present

## 2020-11-23 DIAGNOSIS — I10 Essential (primary) hypertension: Secondary | ICD-10-CM

## 2020-11-23 DIAGNOSIS — Z125 Encounter for screening for malignant neoplasm of prostate: Secondary | ICD-10-CM

## 2020-11-23 DIAGNOSIS — Z114 Encounter for screening for human immunodeficiency virus [HIV]: Secondary | ICD-10-CM

## 2020-11-23 DIAGNOSIS — Z1159 Encounter for screening for other viral diseases: Secondary | ICD-10-CM

## 2020-11-23 DIAGNOSIS — Z1211 Encounter for screening for malignant neoplasm of colon: Secondary | ICD-10-CM

## 2020-11-23 DIAGNOSIS — E785 Hyperlipidemia, unspecified: Secondary | ICD-10-CM | POA: Diagnosis not present

## 2020-11-23 LAB — LIPID PANEL
Cholesterol: 266 mg/dL — ABNORMAL HIGH (ref 0–200)
HDL: 51.2 mg/dL (ref >39.00)
LDL Cholesterol: 182 mg/dL — ABNORMAL HIGH (ref 0–99)
NonHDL: 214.84
Total CHOL/HDL Ratio: 5
Triglycerides: 164 mg/dL — ABNORMAL HIGH (ref 0.0–149.0)
VLDL: 32.8 mg/dL (ref 0.0–40.0)

## 2020-11-23 LAB — COMPREHENSIVE METABOLIC PANEL
ALT: 24 U/L (ref 0–53)
AST: 16 U/L (ref 0–37)
Albumin: 4 g/dL (ref 3.5–5.2)
Alkaline Phosphatase: 74 U/L (ref 39–117)
BUN: 14 mg/dL (ref 6–23)
CO2: 29 mEq/L (ref 19–32)
Calcium: 8.8 mg/dL (ref 8.4–10.5)
Chloride: 101 mEq/L (ref 96–112)
Creatinine, Ser: 0.82 mg/dL (ref 0.40–1.50)
GFR: 96.73 mL/min (ref >60.00)
Glucose, Bld: 118 mg/dL — ABNORMAL HIGH (ref 70–99)
Potassium: 4.2 mEq/L (ref 3.5–5.1)
Sodium: 138 mEq/L (ref 135–145)
Total Bilirubin: 0.4 mg/dL (ref 0.2–1.2)
Total Protein: 6.9 g/dL (ref 6.0–8.3)

## 2020-11-23 LAB — POCT URINALYSIS DIPSTICK
Bilirubin, UA: NEGATIVE
Blood, UA: NEGATIVE
Glucose, UA: NEGATIVE
Ketones, UA: NEGATIVE
Leukocytes, UA: NEGATIVE
Nitrite, UA: NEGATIVE
Protein, UA: NEGATIVE
Spec Grav, UA: 1.025 (ref 1.010–1.025)
Urobilinogen, UA: 0.2 E.U./dL
pH, UA: 5.5 (ref 5.0–8.0)

## 2020-11-23 LAB — CBC WITH DIFFERENTIAL/PLATELET
Basophils Absolute: 0.1 10*3/uL (ref 0.0–0.1)
Basophils Relative: 0.9 % (ref 0.0–3.0)
Eosinophils Absolute: 0.3 10*3/uL (ref 0.0–0.7)
Eosinophils Relative: 3 % (ref 0.0–5.0)
HCT: 38.9 % — ABNORMAL LOW (ref 39.0–52.0)
Hemoglobin: 13 g/dL (ref 13.0–17.0)
Lymphocytes Relative: 27.2 % (ref 12.0–46.0)
Lymphs Abs: 2.3 10*3/uL (ref 0.7–4.0)
MCHC: 33.5 g/dL (ref 30.0–36.0)
MCV: 86 fl (ref 78.0–100.0)
Monocytes Absolute: 0.6 10*3/uL (ref 0.1–1.0)
Monocytes Relative: 6.7 % (ref 3.0–12.0)
Neutro Abs: 5.4 10*3/uL (ref 1.4–7.7)
Neutrophils Relative %: 62.2 % (ref 43.0–77.0)
Platelets: 264 10*3/uL (ref 150.0–400.0)
RBC: 4.52 Mil/uL (ref 4.22–5.81)
RDW: 13.7 % (ref 11.5–15.5)
WBC: 8.6 10*3/uL (ref 4.0–10.5)

## 2020-11-23 LAB — MICROALBUMIN / CREATININE URINE RATIO
Creatinine,U: 127.9 mg/dL
Microalb Creat Ratio: 2 mg/g (ref 0.0–30.0)
Microalb, Ur: 2.6 mg/dL — ABNORMAL HIGH (ref 0.0–1.9)

## 2020-11-23 LAB — PSA: PSA: 0.45 ng/mL (ref 0.10–4.00)

## 2020-11-23 LAB — HEMOGLOBIN A1C: Hgb A1c MFr Bld: 7 % — ABNORMAL HIGH (ref 4.6–6.5)

## 2020-11-23 LAB — TSH: TSH: 5.21 u[IU]/mL — ABNORMAL HIGH (ref 0.35–4.50)

## 2020-11-23 MED ORDER — AMLODIPINE BESYLATE 5 MG PO TABS: 5.0000 mg | ORAL_TABLET | Freq: Every day | ORAL | 1 refills | Status: DC

## 2020-11-23 MED ORDER — ROSUVASTATIN CALCIUM 10 MG PO TABS: ORAL_TABLET | ORAL | 3 refills | Status: DC

## 2020-11-23 MED ORDER — AMLODIPINE BESYLATE 10 MG PO TABS: 10.0000 mg | ORAL_TABLET | Freq: Every day | ORAL | 3 refills | Status: DC

## 2020-11-23 MED ORDER — VALSARTAN-HYDROCHLOROTHIAZIDE 160-25 MG PO TABS: 1.0000 | ORAL_TABLET | Freq: Every day | ORAL | 1 refills | Status: DC

## 2020-11-23 NOTE — Patient Instructions (Signed)
1) your blood pressure continues to be high, stroke level high. You need to take  Your pill daily. I also sent in another medication called norvasc that you will take daily as well.   2) start back your cholesterol medication (crestor)  3) will check diabetes lab today  See you back in 2 weeks for bp check    Preventive Care 26-59 Years Old, Male Preventive care refers to lifestyle choices and visits with your health care provider that can promote health and wellness. This includes:  A yearly physical exam. This is also called an annual wellness visit.  Regular dental and eye exams.  Immunizations.  Screening for certain conditions.  Healthy lifestyle choices, such as: ? Eating a healthy diet. ? Getting regular exercise. ? Not using drugs or products that contain nicotine and tobacco. ? Limiting alcohol use. What can I expect for my preventive care visit? Physical exam Your health care provider will check your:  Height and weight. These may be used to calculate your BMI (body mass index). BMI is a measurement that tells if you are at a healthy weight.  Heart rate and blood pressure.  Body temperature.  Skin for abnormal spots. Counseling Your health care provider may ask you questions about your:  Past medical problems.  Family's medical history.  Alcohol, tobacco, and drug use.  Emotional well-being.  Home life and relationship well-being.  Sexual activity.  Diet, exercise, and sleep habits.  Work and work Astronomer.  Access to firearms. What immunizations do I need? Vaccines are usually given at various ages, according to a schedule. Your health care provider will recommend vaccines for you based on your age, medical history, and lifestyle or other factors, such as travel or where you work.   What tests do I need? Blood tests  Lipid and cholesterol levels. These may be checked every 5 years, or more often if you are over 28 years old.  Hepatitis C  test.  Hepatitis B test. Screening  Lung cancer screening. You may have this screening every year starting at age 60 if you have a 30-pack-year history of smoking and currently smoke or have quit within the past 15 years.  Prostate cancer screening. Recommendations will vary depending on your family history and other risks.  Genital exam to check for testicular cancer or hernias.  Colorectal cancer screening. ? All adults should have this screening starting at age 47 and continuing until age 60. ? Your health care provider may recommend screening at age 35 if you are at increased risk. ? You will have tests every 1-10 years, depending on your results and the type of screening test.  Diabetes screening. ? This is done by checking your blood sugar (glucose) after you have not eaten for a while (fasting). ? You may have this done every 1-3 years.  STD (sexually transmitted disease) testing, if you are at risk. Follow these instructions at home: Eating and drinking  Eat a diet that includes fresh fruits and vegetables, whole grains, lean protein, and low-fat dairy products.  Take vitamin and mineral supplements as recommended by your health care provider.  Do not drink alcohol if your health care provider tells you not to drink.  If you drink alcohol: ? Limit how much you have to 0-2 drinks a day. ? Be aware of how much alcohol is in your drink. In the [place] one drink equals one 12 oz bottle of beer (355 mL), one 5 oz glass of wine (148  mL), or one 1 oz glass of hard liquor (44 mL).   Lifestyle  Take daily care of your teeth and gums. Brush your teeth every morning and night with fluoride toothpaste. Floss one time each day.  Stay active. Exercise for at least 30 minutes 5 or more days each week.  Do not use any products that contain nicotine or tobacco, such as cigarettes, e-cigarettes, and chewing tobacco. If you need help quitting, ask your health care provider.  Do not use  drugs.  If you are sexually active, practice safe sex. Use a condom or other form of protection to prevent STIs (sexually transmitted infections).  If told by your health care provider, take low-dose aspirin daily starting at age 104.  Find healthy ways to cope with stress, such as: ? Meditation, yoga, or listening to music. ? Journaling. ? Talking to a trusted person. ? Spending time with friends and family. Safety  Always wear your seat belt while driving or riding in a vehicle.  Do not drive: ? If you have been drinking alcohol. Do not ride with someone who has been drinking. ? When you are tired or distracted. ? While texting.  Wear a helmet and other protective equipment during sports activities.  If you have firearms in your house, make sure you follow all gun safety procedures. What's next?  Go to your health care provider once a year for an annual wellness visit.  Ask your health care provider how often you should have your eyes and teeth checked.  Stay up to date on all vaccines. This information is not intended to replace advice given to you by your health care provider. Make sure you discuss any questions you have with your health care provider. Document Revised: 05/26/2019 Document Reviewed: 08/21/2018 Elsevier Patient Education  2021 [employer].

## 2020-11-23 NOTE — Telephone Encounter (Signed)
Please call [facility] and let them know that Donald Cole is NOT cleared for surgery until he gets his blood pressure under more decent control. Will send paperwork over when cleared. (left knee replacement with dr. Jerl Santos)   Thanks,  Dr. Artis Flock   Number: [phone]

## 2020-11-23 NOTE — Telephone Encounter (Signed)
I called [facility] to give message below. Message has been documented.

## 2020-11-23 NOTE — Progress Notes (Signed)
Patient: Donald Cole MRN: [id number] DOB: 1961/02/28 PCP: Orland Mustard, MD     Subjective:  Chief Complaint  Patient presents with  . Annual Exam  . Hypertension  . Hyperlipidemia  . Diabetes  . Urinary Frequency    HPI: The patient is a 60 y.o. male who presents today for annual exam and chronic medical follow up. He has been very non compliant with follow up and taking medication. He has a lot to discuss today and also wants pre op clearance for knee surgery for a TKA of the left knee.  He denies any changes to past medical history. There have been no recent hospitalizations. They are not following a well balanced diet and exercise plan. Weight has been increasing steadily. Pt complains of knee pain.    Hypertension: Here for follow up of hypertension.  Currently on valsartan-hctz 160-25mg /day.  Does not take medication as prescribed and denies any side effects. He does not take on a daily basis. Has a lot of interesting views on why his blood pressure is so high.  Exercise includes none. Weight has been stable. Denies any chest pain, headaches, shortness of breath, vision changes, swelling in lower extremities. He has poor compliance and poor follow up.   Type 2 diabetes Have not seen him since 2020. Last a1c was decently controlled. He is on metformin 500mg /day. He does state he takes this daily. No weight loss, no diabetic diet. Poor follow up.   Urinary issues He has urinary frequency and has to wear pads. He has to get up and urinate every hour. Even when he doesn't take his blood pressure medication he still has an issue. He feels like he can't hold his urine either. He has had this going on x 1 year. He doesn't have a weak stream, but may take him a long time to get it going. He has to strain to get urine out at first. No blood in his urine. No hx of smoking.    hyperlipidemia Not taking his cholesterol pill.  The 10-year ASCVD risk score DC Jr., et al., 2013)  is: 48.8%    There is no immunization history on file for this patient.   Colonoscopy:: never, ordered today.  PSA: today   Review of Systems  Constitutional: Negative for chills, fatigue and fever.  HENT: Negative for dental problem, ear pain, hearing loss and trouble swallowing.   Eyes: Negative for visual disturbance.  Respiratory: Negative for cough, chest tightness and shortness of breath.   Cardiovascular: Negative for chest pain, palpitations and leg swelling.  Gastrointestinal: Negative for abdominal pain, blood in stool, diarrhea and nausea.  Endocrine: Negative for cold intolerance, polydipsia, polyphagia and polyuria.  Genitourinary: Negative for dysuria and hematuria.  Musculoskeletal: Positive for arthralgias and joint swelling. Negative for myalgias.  Skin: Negative for rash.  Neurological: Negative for dizziness and headaches.  Hematological: Positive for adenopathy.  Psychiatric/Behavioral: Negative for dysphoric mood and sleep disturbance. The patient is not nervous/anxious.     Allergies Patient is allergic to tramadol.  Past Medical History Patient  has a past medical history of Diabetes mellitus without complication (HCC), Hypercholesterolemia, Hypertension, Pre-diabetes, and Rupture of UCL of right thumb.  Surgical History Patient  has a past surgical history that includes Ligament repair (Right, 11/14/2018).  Family History Pateint's family history includes Cancer in his mother.  Social History Patient  reports that he has never smoked. He has never used smokeless tobacco. He reports that he does not drink  alcohol and does not use drugs.    Objective: Vitals:   11/23/20 0839 11/23/20 0915  BP: (!) 200/110 (!) 200/110  Pulse: 68   Temp: 98.2 F (36.8 C)   TempSrc: Temporal   SpO2: 96%   Weight: 239 lb 3.2 oz (108.5 kg)   Height: 5\' 8"  (1.727 m)     Body mass index is 36.37 kg/m.  Physical Exam Vitals reviewed.  Constitutional:       General: He is not in acute distress.    Appearance: Normal appearance. He is obese. He is not ill-appearing.  HENT:     Head: Normocephalic and atraumatic.     Right Ear: Tympanic membrane, ear canal and external ear normal.     Left Ear: Tympanic membrane, ear canal and external ear normal.     Mouth/Throat:     Mouth: Mucous membranes are moist.  Eyes:     Extraocular Movements: Extraocular movements intact.     Pupils: Pupils are equal, round, and reactive to light.  Neck:     Vascular: No carotid bruit.  Cardiovascular:     Rate and Rhythm: Normal rate and regular rhythm.     Pulses: Normal pulses.     Heart sounds: Normal heart sounds.  Pulmonary:     Effort: Pulmonary effort is normal.     Breath sounds: Normal breath sounds.  Abdominal:     General: Bowel sounds are normal.     Palpations: Abdomen is soft.  Musculoskeletal:     Cervical back: Normal range of motion and neck supple.  Lymphadenopathy:     Head:     Right side of head: Preauricular adenopathy present.     Comments: Parotid gland vs. Lymph node. Non tender about 2cm in diameter. Mobile.   Skin:    Capillary Refill: Capillary refill takes less than 2 seconds.  Neurological:     General: No focal deficit present.     Mental Status: He is alert and oriented to person, place, and time.  Psychiatric:        Mood and Affect: Mood normal.        Behavior: Behavior normal.    Flowsheet Row Office Visit from 11/23/2020 in [facility]  PHQ-2 Total Score 0         Assessment/plan: 1. Annual physical exam Routine labs. HM far from utd. Colonoscopy ordered today. Needs to work on diet and exercise and compliance in general. Will need more time for more family history at f/u.  Patient counseling [x]    Nutrition: Stressed importance of moderation in sodium/caffeine intake, saturated fat and cholesterol, caloric balance, sufficient intake of fresh fruits, vegetables, fiber, calcium, iron,  and 1 mg of folate supplement per day (for females capable of pregnancy).  [x]    Stressed the importance of regular exercise.   []    Substance Abuse: Discussed cessation/primary prevention of tobacco, alcohol, or other drug use; driving or other dangerous activities under the influence; availability of treatment for abuse.   [x]    Injury prevention: Discussed safety belts, safety helmets, smoke detector, smoking near bedding or upholstery.   [x]    Sexuality: Discussed sexually transmitted diseases, partner selection, use of condoms, avoidance of unintended pregnancy  and contraceptive alternatives.  [x]    Dental health: Discussed importance of regular tooth brushing, flossing, and dental visits.  [x]    Health maintenance and immunizations reviewed. Please refer to Health maintenance section.    - TSH  2. Benign essential HTN Extremely  elevated, but asymptomatic. He has had poor, poor compliance and follow up. Does not take meds, does not follow up and treats ER like doctor office. Had long and stern discussion with him regarding this today. Keeping him on diovan and adding on norvasc 10mg . Will likely need more than this. I know hes in some pain, but he has had chronically elevated blood pressure. Discussed risks again of untreated HTN and discussed I WILL NOT clear him for surgery as he is too high a stroke or cardiac risk. Close f/u in 2 weeks. Let ortho know.  - CBC with Differential/Platelet - Comprehensive metabolic panel - Microalbumin / creatinine urine ratio  3. Diabetes mellitus without complication (HCC) Has not been seen in 2+ years. States he is taking metformin but do not believe he is compliant with this.  -a1c today  - Hemoglobin A1c  4. Hyperlipidemia associated with type 2 diabetes mellitus (HCC) Not been taking crestor. Refill given.  - Lipid panel  5. Screening for prostate cancer  - PSA  6. Encounter for screening for HIV  - HIV Antibody (routine testing w  rflx)  7. Encounter for hepatitis C screening test for low risk patient  - Hepatitis C antibody  8. Urinary frequency -concern for prostate issues. Checking urine culture and psa as well. ua looks fine.  - Urine Culture - POCT urinalysis dipstick  9. 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 States he has had for years and years. Fluctuates. Will address more at f/u in 2 weeks.   This visit occurred during the SARS-CoV-2 public health emergency.  Safety protocols were in place, including screening questions prior to the visit, additional usage of staff PPE, and extensive cleaning of exam room while observing appropriate contact time as indicated for disinfecting solutions.     Return in about 2 weeks (around 12/07/2020) for blood pressure .     12/09/2020, MD [facility]  11/23/2020

## 2020-11-24 LAB — HEPATITIS C ANTIBODY
Hepatitis C Ab: NONREACTIVE
SIGNAL TO CUT-OFF: 0.02 (ref <1.00)

## 2020-11-24 LAB — URINE CULTURE
MICRO NUMBER:: 11654385
Result:: NO GROWTH
SPECIMEN QUALITY:: ADEQUATE

## 2020-11-24 LAB — HIV ANTIBODY (ROUTINE TESTING W REFLEX): HIV 1&2 Ab, 4th Generation: NONREACTIVE

## 2020-11-25 ENCOUNTER — Telehealth: Payer: Self-pay

## 2020-11-25 NOTE — Progress Notes (Signed)
I spoke with pt to go over lab results message. Pt expressed understanding, pt was transferred to the front to make a 3 month f/u.

## 2020-11-25 NOTE — Telephone Encounter (Signed)
Pt called asking to speak with CMA about his lab work. Please advise.

## 2020-11-29 ENCOUNTER — Encounter (HOSPITAL_COMMUNITY): Admission: RE | Admit: 2020-11-29 | Payer: 59 | Source: Ambulatory Visit

## 2020-12-05 ENCOUNTER — Ambulatory Visit: Payer: 59 | Admitting: Family Medicine

## 2020-12-05 DIAGNOSIS — Z0289 Encounter for other administrative examinations: Secondary | ICD-10-CM

## 2020-12-06 ENCOUNTER — Ambulatory Visit: Admit: 2020-12-06 | Payer: 59 | Admitting: Orthopaedic Surgery

## 2020-12-06 SURGERY — ARTHROPLASTY, KNEE, TOTAL
Anesthesia: Spinal | Site: Knee | Laterality: Left

## 2020-12-09 ENCOUNTER — Telehealth: Payer: Self-pay

## 2020-12-09 NOTE — Telephone Encounter (Signed)
Pt is asking if there is another formula that could be prescribed for him. He states his levels are still reading at 250-270

## 2020-12-09 NOTE — Telephone Encounter (Signed)
Pt called following up on previous message. Pt wanted to know how much medication he should take since his glucose is still reading high. Please advise.

## 2020-12-11 NOTE — Telephone Encounter (Signed)
-   first he shouldn't really be checking his sugars since not on insulin. His a1c was 7.0. I increased his metformin to 500mg  TWICE a day. When is he taking his sugar?  -he also missed his appointment for his blood pressure. How are those readings?  Dr. 

## 2020-12-12 NOTE — Telephone Encounter (Signed)
Pt confirms that he takes 500 mg (twice daily). He says that he checks his sugar throughout the day, especially when he has funny feeling. He also states that he does not have a B/P cuff at home , so he does not check his BP. His follow up appointment was rescheduled because he arrived 30 min late. Appointment is on 12/16/2020.

## 2020-12-16 ENCOUNTER — Encounter: Payer: Self-pay | Admitting: Family Medicine

## 2020-12-16 ENCOUNTER — Ambulatory Visit: Payer: 59 | Admitting: Family Medicine

## 2020-12-16 ENCOUNTER — Other Ambulatory Visit: Payer: Self-pay

## 2020-12-16 VITALS — BP 128/74 | HR 65 | Temp 98.0°F | Ht 68.0 in | Wt 235.4 lb

## 2020-12-16 DIAGNOSIS — E119 Type 2 diabetes mellitus without complications: Secondary | ICD-10-CM | POA: Diagnosis not present

## 2020-12-16 DIAGNOSIS — I10 Essential (primary) hypertension: Secondary | ICD-10-CM

## 2020-12-16 DIAGNOSIS — K111 Hypertrophy of salivary gland: Secondary | ICD-10-CM | POA: Diagnosis not present

## 2020-12-16 MED ORDER — METFORMIN HCL 1000 MG PO TABS
1000.0000 mg | ORAL_TABLET | Freq: Two times a day (BID) | ORAL | 3 refills | Status: DC
Start: 2020-12-16 — End: 2022-01-04

## 2020-12-16 NOTE — Patient Instructions (Addendum)
-  for sinuses/allergies.  Start zyrtec daily and flonase daily. Could try cool mist humidifier at nigh.   -ordering CT of your head to check on that parotid gland and make sure no stone or tumor. You can schedule when you have time.   -your blood pressure is great! Keep up the 2 blood pressure pills, very proud of you! Cleared for surgery, will call surgeon and get you going.   Dr. Artis Flock

## 2020-12-16 NOTE — Progress Notes (Signed)
Patient: Donald Cole MRN: [id number] DOB: [birth year] PCP: Orland Mustard, MD     Subjective:  Chief Complaint  Patient presents with  . Hypertension  . enlarged parotid gland  . Diabetes    HPI: The patient is a 60 y.o. male who presents today for blood pressure follow up after addition of medication. I added on norvasc 10mg  at his last appointment 2 weeks ago and continued his diovan-hct. He is trying to have surgery done and I did not release him for this until we get his blood pressure under better control. He is here for follow up with his log. He has had no side effects with the new medication. He is only taking 5mg  instead of the 10mg  norvasc.    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 He states they both will swell up and then go back to normal. His right side has stayed enlarged for a while. He was given antibiotics at one point, but did not take this. He states he does feel like his mouth stays dry a lot.   He has chronic sinus congestion. He has been using vicks nasal spray. Not taking any allergy pills.   Also he wanted to let me know he was already taking his metformin twice a day.   Review of Systems  Constitutional: Negative for chills, fatigue and fever.  HENT: Negative for dental problem, ear pain, hearing loss and trouble swallowing.   Eyes: Negative for visual disturbance.  Respiratory: Negative for cough, chest tightness and shortness of breath.   Cardiovascular: Negative for chest pain, palpitations and leg swelling.  Gastrointestinal: Negative for abdominal pain, blood in stool, diarrhea and nausea.  Endocrine: Negative for cold intolerance, polydipsia, polyphagia and polyuria.  Genitourinary: Negative for dysuria and hematuria.  Musculoskeletal: Positive for arthralgias.  Skin: Negative for rash.  Neurological: Negative for dizziness and headaches.  Hematological: Positive for adenopathy.  Psychiatric/Behavioral: Negative for dysphoric mood and  sleep disturbance. The patient is not nervous/anxious.     Allergies Patient is allergic to tramadol.  Past Medical History Patient  has a past medical history of Diabetes mellitus without complication (HCC), Hypercholesterolemia, Hypertension, Pre-diabetes, and Rupture of UCL of right thumb.  Surgical History Patient  has a past surgical history that includes Ligament repair (Right, 11/14/2018).  Family History Pateint's family history includes Cancer in his mother.  Social History Patient  reports that he has never smoked. He has never used smokeless tobacco. He reports that he does not drink alcohol and does not use drugs.    Objective: Vitals:   12/16/20 1249  BP: 128/74  Pulse: 65  Temp: 98 F (36.7 C)  TempSrc: Temporal  SpO2: 97%  Weight: 235 lb 6.4 oz (106.8 kg)  Height: 5\' 8"  (1.727 m)    Body mass index is 35.79 kg/m.  Physical Exam Vitals reviewed.  Constitutional:      Appearance: Normal appearance. He is well-developed. He is obese.  HENT:     Head: Normocephalic and atraumatic.     Right Ear: External ear normal.     Left Ear: External ear normal.  Eyes:     Conjunctiva/sclera: Conjunctivae normal.     Pupils: Pupils are equal, round, and reactive to light.  Neck:     Thyroid: No thyromegaly.     Comments: Enlarged right parotid gland  Cardiovascular:     Rate and Rhythm: Normal rate and regular rhythm.     Heart sounds: Normal heart sounds. No murmur heard.  Pulmonary:     Effort: Pulmonary effort is normal.     Breath sounds: Normal breath sounds.  Abdominal:     General: Bowel sounds are normal. There is no distension.     Palpations: Abdomen is soft.     Tenderness: There is no abdominal tenderness.  Musculoskeletal:     Cervical back: Normal range of motion and neck supple.  Lymphadenopathy:     Cervical: No cervical adenopathy.  Skin:    General: Skin is warm and dry.     Capillary Refill: Capillary refill takes less than 2 seconds.      Findings: No rash.  Neurological:     General: No focal deficit present.     Mental Status: He is alert and oriented to person, place, and time.     Cranial Nerves: No cranial nerve deficit.     Coordination: Coordination normal.     Deep Tendon Reflexes: Reflexes normal.  Psychiatric:        Mood and Affect: Mood normal.        Behavior: Behavior normal.        Assessment/plan: 1. Benign essential HTN Blood pressure to goal. Very proud of him. Continue norvasc 5mg /day and his diovan daily. Cleared for surgery. Will fax over note.   2. Enlarged parotid gland Stone vs. Mass vs. Physiological enlargement. CT scan ordered.  - CT Soft Tissue Neck W Contrast; Future  3. Diabetes mellitus without complication (HCC) Was already on 500mg  BID. A1c is 7.0 so I advised we increase to 1000mg  BID with food. New px sent in for him. Recheck a1c in 3 months time.     Return if symptoms worsen or fail to improve.     , MD [facility]  12/16/2020

## 2020-12-21 ENCOUNTER — Other Ambulatory Visit: Payer: Self-pay | Admitting: Orthopaedic Surgery

## 2020-12-21 DIAGNOSIS — Z01818 Encounter for other preprocedural examination: Secondary | ICD-10-CM

## 2021-01-04 NOTE — Progress Notes (Addendum)
COVID Vaccine Completed:  No Date COVID Vaccine completed: Has received booster: COVID vaccine manufacturer: Pfizer    [employer]   Date of COVID positive in last 90 days: N/A  PCP - Orland Mustard, MD Cardiologist - N/A  Chest x-ray - 01-05-21 Epic EKG - 06-26-20 Epic Stress Test - N/A ECHO - 07-14-19 Epic Cardiac Cath - N/A Pacemaker/ICD device last checked: Spinal Cord Stimulator:  Sleep Study - 10+ years ago, unable to get a good reading.   CPAP - No  Fasting Blood Sugar - Does not check Checks Blood Sugar -  Blood Thinner Instructions:  N/A Aspirin Instructions: Last Dose:  Activity level:  Can go up a flight of stairs and perform activities of daily living without stopping and without symptoms of chest pain or shortness of breath.      Anesthesia review:  Hx of palpitations evaluated by echo 2020.  Second surgery posting, rescheduled due to uncontrolled HTN.    BP 137/92 at PAT  Stop Bang score 5  Patient denies shortness of breath, fever, cough and chest pain at PAT appointment   Patient verbalized understanding of instructions that were given to them at the PAT appointment. Patient was also instructed that they will need to review over the PAT instructions again at home before surgery.

## 2021-01-04 NOTE — Patient Instructions (Addendum)
DUE TO COVID-19 ONLY ONE VISITOR IS ALLOWED TO COME WITH YOU AND STAY IN THE WAITING ROOM ONLY DURING PRE OP AND PROCEDURE.   **NO VISITORS ARE ALLOWED IN THE SHORT STAY AREA OR RECOVERY ROOM!!**  IF YOU WILL BE ADMITTED INTO THE HOSPITAL YOU ARE ALLOWED ONLY TWO SUPPORT PEOPLE DURING VISITATION HOURS ONLY (10AM -8PM)   . The support person(s) may change daily. . The support person(s) must pass our screening, gel in and out, and wear a mask at all times, including in the patient's room. . Patients must also wear a mask when staff or their support person are in the room.  No visitors under the age of 59. Any visitor under the age of 59 must be accompanied by an adult.    COVID SWAB TESTING MUST BE COMPLETED ON:  Friday, 01-06-21 @ 11:05 AM   [address] [place], [place] [id number]  (Must self quarantine after testing. Follow instructions on handout.)    Your procedure is scheduled on: Tuesday, 01-10-21   [facility] Main  Entrance   Report to Short Stay at 5:15 AM   [facility](Map Enclosed)   Call this number if you have problems the morning of surgery [phone]   Do not eat food :After Midnight.   May have liquids until 4:30 AM day of surgery  CLEAR LIQUID DIET  Foods Allowed                                                                     Foods Excluded  Water, Black Coffee and tea, regular and decaf              liquids that you cannot  Plain Jell-O in any flavor  (No red)                                    see through such as: Fruit ices (not with fruit pulp)                                      milk, soups, orange juice              Iced Popsicles (No red)                                      All solid food                                   Apple juices Sports drinks like Gatorade (No red) Lightly seasoned clear broth or consume(fat free) Sugar, honey syrup     Oral Hygiene is also important to reduce your risk of infection.                                     Remember - BRUSH YOUR TEETH THE MORNING OF SURGERY WITH  YOUR REGULAR TOOTHPASTE   Do NOT smoke after Midnight   Take these medicines the morning of surgery with A SIP OF WATER: Amlodipine  DO NOT TAKE ANY ORAL DIABETIC MEDICATIONS DAY OF YOUR SURGERYHow to Manage Your Diabetes Before and After Surgery  Why is it important to control my blood sugar before and after surgery? . Improving blood sugar levels before and after surgery helps healing and can limit problems. . A way of improving blood sugar control is eating a healthy diet by: o  Eating less sugar and carbohydrates o  Increasing activity/exercise o  Talking with your doctor about reaching your blood sugar goals . High blood sugars (greater than 180 mg/dL) can raise your risk of infections and slow your recovery, so you will need to focus on controlling your diabetes during the weeks before surgery. . Make sure that the doctor who takes care of your diabetes knows about your planned surgery including the date and location.  How do I manage my blood sugar before surgery? . Check your blood sugar at least 4 times a day, starting 2 days before surgery, to make sure that the level is not too high or low. o Check your blood sugar the morning of your surgery when you wake up and every 2 hours until you get to the Short Stay unit. . If your blood sugar is less than 70 mg/dL, you will need to treat for low blood sugar: o Do not take insulin. o Treat a low blood sugar (less than 70 mg/dL) with  cup of clear juice (cranberry or apple), 4 glucose tablets, OR glucose gel. o Recheck blood sugar in 15 minutes after treatment (to make sure it is greater than 70 mg/dL). If your blood sugar is not greater than 70 mg/dL on recheck, call [phone] for further instructions. . Report your blood sugar to the short stay nurse when you get to Short Stay.  . If you are admitted to the hospital after surgery: o Your blood sugar will be checked by the  staff and you will probably be given insulin after surgery (instead of oral diabetes medicines) to make sure you have good blood sugar levels. o The goal for blood sugar control after surgery is 80-180 mg/dL.   WHAT DO I DO ABOUT MY DIABETES MEDICATION?  Marland Kitchen Do not take oral diabetes medicines (pills) the morning of surgery.  . THE DAY BEFORE SURGERY: Take Metformin as prescribed.       . THE MORNING OF SURGERY:  Do not take Metformin   Reviewed and Endorsed by [facility] Patient Education Committee, August 2015                               You may not have any metal on your body including jewelry, and body piercings             Do not wear lotions, powders, cologne, or deodorant             Men may shave face and neck.   Do not bring valuables to the hospital. [facility] IS NOT RESPONSIBLE   FOR VALUABLES.   Contacts, dentures or bridgework may not be worn into surgery.   Patients discharged the day of surgery will not be allowed to drive home.             Please read over the following fact sheets you were given: IF YOU  HAVE QUESTIONS ABOUT YOUR PRE OP INSTRUCTIONS PLEASE CALL  [phone] [facility] - Preparing for Surgery Before surgery, you can play an important role.  Because skin is not sterile, your skin needs to be as free of germs as possible.  You can reduce the number of germs on your skin by washing with CHG (chlorahexidine gluconate) soap before surgery.  CHG is an antiseptic cleaner which kills germs and bonds with the skin to continue killing germs even after washing. Please DO NOT use if you have an allergy to CHG or antibacterial soaps.  If your skin becomes reddened/irritated stop using the CHG and inform your nurse when you arrive at Short Stay. Do not shave (including legs and underarms) for at least 48 hours prior to the first CHG shower.  You may shave your face/neck.  Please follow these instructions carefully:  1.  Shower with CHG Soap the night  before surgery and the  morning of surgery.  2.  If you choose to wash your hair, wash your hair first as usual with your normal  shampoo.  3.  After you shampoo, rinse your hair and body thoroughly to remove the shampoo.                             4.  Use CHG as you would any other liquid soap.  You can apply chg directly to the skin and wash.  Gently with a scrungie or clean washcloth.  5.  Apply the CHG Soap to your body ONLY FROM THE NECK DOWN.   Do   not use on face/ open                           Wound or open sores. Avoid contact with eyes, ears mouth and   genitals (private parts).                       Wash face,  Genitals (private parts) with your normal soap.             6.  Wash thoroughly, paying special attention to the area where your    surgery  will be performed.  7.  Thoroughly rinse your body with warm water from the neck down.  8.  DO NOT shower/wash with your normal soap after using and rinsing off the CHG Soap.                9.  Pat yourself dry with a clean towel.            10.  Wear clean pajamas.            11.  Place clean sheets on your bed the night of your first shower and do not  sleep with pets. Day of Surgery : Do not apply any lotions/deodorants the morning of surgery.  Please wear clean clothes to the hospital/surgery center.  FAILURE TO FOLLOW THESE INSTRUCTIONS MAY RESULT IN THE CANCELLATION OF YOUR SURGERY  PATIENT SIGNATURE_________________________________  NURSE SIGNATURE__________________________________  ________________________________________________________________________   Donald Cole  An incentive spirometer is a tool that can help keep your lungs clear and active. This tool measures how well you are filling your lungs with each breath. Taking long deep breaths may help reverse or decrease the chance of developing breathing (pulmonary) problems (especially infection) following:  A long period of time  when you are unable to move  or be active. BEFORE THE PROCEDURE   If the spirometer includes an indicator to show your best effort, your nurse or respiratory therapist will set it to a desired goal.  If possible, sit up straight or lean slightly forward. Try not to slouch.  Hold the incentive spirometer in an upright position. INSTRUCTIONS FOR USE  1. Sit on the edge of your bed if possible, or sit up as far as you can in bed or on a chair. 2. Hold the incentive spirometer in an upright position. 3. Breathe out normally. 4. Place the mouthpiece in your mouth and seal your lips tightly around it. 5. Breathe in slowly and as deeply as possible, raising the piston or the ball toward the top of the column. 6. Hold your breath for 3-5 seconds or for as long as possible. Allow the piston or ball to fall to the bottom of the column. 7. Remove the mouthpiece from your mouth and breathe out normally. 8. Rest for a few seconds and repeat Steps 1 through 7 at least 10 times every 1-2 hours when you are awake. Take your time and take a few normal breaths between deep breaths. 9. The spirometer may include an indicator to show your best effort. Use the indicator as a goal to work toward during each repetition. 10. After each set of 10 deep breaths, practice coughing to be sure your lungs are clear. If you have an incision (the cut made at the time of surgery), support your incision when coughing by placing a pillow or rolled up towels firmly against it. Once you are able to get out of bed, walk around indoors and cough well. You may stop using the incentive spirometer when instructed by your caregiver.  RISKS AND COMPLICATIONS  Take your time so you do not get dizzy or light-headed.  If you are in pain, you may need to take or ask for pain medication before doing incentive spirometry. It is harder to take a deep breath if you are having pain. AFTER USE  Rest and breathe slowly and easily.  It can be helpful to keep track of a  log of your progress. Your caregiver can provide you with a simple table to help with this. If you are using the spirometer at home, follow these instructions: SEEK MEDICAL CARE IF:   You are having difficultly using the spirometer.  You have trouble using the spirometer as often as instructed.  Your pain medication is not giving enough relief while using the spirometer.  You develop fever of 100.5 F (38.1 C) or higher. SEEK IMMEDIATE MEDICAL CARE IF:   You cough up bloody sputum that had not been present before.  You develop fever of 102 F (38.9 C) or greater.  You develop worsening pain at or near the incision site. MAKE SURE YOU:   Understand these instructions.  Will watch your condition.  Will get help right away if you are not doing well or get worse. Document Released: 01/07/2007 Document Revised: 11/19/2011 Document Reviewed: 03/10/2007 [employer] Patient Information 2014 [employer], [place].   ________________________________________________________________________  WHAT IS A BLOOD TRANSFUSION? Blood Transfusion Information  A transfusion is the replacement of blood or some of its parts. Blood is made up of multiple cells which provide different functions.  Red blood cells carry oxygen and are used for blood loss replacement.  White blood cells fight against infection.  Platelets control bleeding.  Plasma helps clot blood.  Other blood products are available for specialized needs, such as hemophilia or other clotting disorders. BEFORE THE TRANSFUSION  Who gives blood for transfusions?   Healthy volunteers who are fully evaluated to make sure their blood is safe. This is blood bank blood. Transfusion therapy is the safest it has ever been in the practice of medicine. Before blood is taken from a donor, a complete history is taken to make sure that person has no history of diseases nor engages in risky social behavior (examples are intravenous drug use or sexual  activity with multiple partners). The donor's travel history is screened to minimize risk of transmitting infections, such as malaria. The donated blood is tested for signs of infectious diseases, such as HIV and hepatitis. The blood is then tested to be sure it is compatible with you in order to minimize the chance of a transfusion reaction. If you or a relative donates blood, this is often done in anticipation of surgery and is not appropriate for emergency situations. It takes many days to process the donated blood. RISKS AND COMPLICATIONS Although transfusion therapy is very safe and saves many lives, the main dangers of transfusion include:   Getting an infectious disease.  Developing a transfusion reaction. This is an allergic reaction to something in the blood you were given. Every precaution is taken to prevent this. The decision to have a blood transfusion has been considered carefully by your caregiver before blood is given. Blood is not given unless the benefits outweigh the risks. AFTER THE TRANSFUSION  Right after receiving a blood transfusion, you will usually feel much better and more energetic. This is especially true if your red blood cells have gotten low (anemic). The transfusion raises the level of the red blood cells which carry oxygen, and this usually causes an energy increase.  The nurse administering the transfusion will monitor you carefully for complications. HOME CARE INSTRUCTIONS  No special instructions are needed after a transfusion. You may find your energy is better. Speak with your caregiver about any limitations on activity for underlying diseases you may have. SEEK MEDICAL CARE IF:   Your condition is not improving after your transfusion.  You develop redness or irritation at the intravenous (IV) site. SEEK IMMEDIATE MEDICAL CARE IF:  Any of the following symptoms occur over the next 12 hours:  Shaking chills.  You have a temperature by mouth above 102 F  (38.9 C), not controlled by medicine.  Chest, back, or muscle pain.  People around you feel you are not acting correctly or are confused.  Shortness of breath or difficulty breathing.  Dizziness and fainting.  You get a rash or develop hives.  You have a decrease in urine output.  Your urine turns a dark color or changes to pink, red, or brown. Any of the following symptoms occur over the next 10 days:  You have a temperature by mouth above 102 F (38.9 C), not controlled by medicine.  Shortness of breath.  Weakness after normal activity.  The white part of the eye turns yellow (jaundice).  You have a decrease in the amount of urine or are urinating less often.  Your urine turns a dark color or changes to pink, red, or brown. Document Released: 08/24/2000 Document Revised: 11/19/2011 Document Reviewed: 04/12/2008 [facility] Patient Information 2014 [place], [place].  _______________________________________________________________________

## 2021-01-04 NOTE — H&P (Signed)
TOTAL KNEE ADMISSION H&P  Patient is being admitted for left total knee arthroplasty.  Subjective:  Chief Complaint:left knee pain.  HPI: Donald Cole, 60 y.o. male, has a history of pain and functional disability in the left knee due to arthritis and has failed non-surgical conservative treatments for greater than 12 weeks to includeNSAID's and/or analgesics, corticosteriod injections, viscosupplementation injections, flexibility and strengthening excercises, use of assistive devices, weight reduction as appropriate and activity modification.  Onset of symptoms was gradual, starting 5 years ago with gradually worsening course since that time. The patient noted no past surgery on the left knee(s).  Patient currently rates pain in the left knee(s) at 10 out of 10 with activity. Patient has night pain, worsening of pain with activity and weight bearing, pain that interferes with activities of daily living, crepitus and joint swelling.  Patient has evidence of subchondral cysts, subchondral sclerosis, periarticular osteophytes and joint space narrowing by imaging studies. There is no active infection.  Patient Active Problem List   Diagnosis Date Noted  . Osteochondral defect of femoral condyle 06/15/2020  . Grade I diastolic dysfunction 07/16/2019  . Hyperlipidemia associated with type 2 diabetes mellitus (HCC) 07/10/2019  . Diabetes mellitus without complication (HCC) 07/08/2019  . Benign essential HTN 07/08/2019  . Gamekeeper's thumb of right hand 11/11/2018   Past Medical History:  Diagnosis Date  . Diabetes mellitus without complication (HCC)   . Hypercholesterolemia   . Hypertension    non compliant with amlodipine  . Pre-diabetes   . Rupture of UCL of right thumb     Past Surgical History:  Procedure Laterality Date  . LIGAMENT REPAIR Right 11/14/2018   Procedure: RIGHT THUMB ULNAR COLLATERAL LIGAMENT REPAIR;  Surgeon: Dairl Ponder, MD;  Location: [facility];  Service: Orthopedics;  Laterality: Right;    No current facility-administered medications for this encounter.   Current Outpatient Medications  Medication Sig Dispense Refill Last Dose  . amLODipine (NORVASC) 10 MG tablet Take 10 mg by mouth daily.     . Menthol-Camphor (TIGER BALM ARTHRITIS RUB EX) Apply 1 application topically daily as needed (pain).     . metFORMIN (GLUCOPHAGE) 1000 MG tablet Take 1 tablet (1,000 mg total) by mouth 2 (two) times daily with a meal. 180 tablet 3   . Multiple Vitamins-Minerals (ONE-A-DAY ENERGY) TABS Take 1 tablet by mouth daily.     Marland Kitchen OVER THE COUNTER MEDICATION Take 1 Scoop by mouth daily. Super beets     . rosuvastatin (CRESTOR) 10 MG tablet Take one tablet at night for cholesterol (Patient taking differently: Take 10 mg by mouth at bedtime.) 90 tablet 3   . valsartan-hydrochlorothiazide (DIOVAN HCT) 160-25 MG tablet Take 1 tablet by mouth daily. 90 tablet 1   . naproxen sodium (ALEVE) 220 MG tablet Take 880 mg by mouth daily as needed (pain). (Patient not taking: Reported on 12/28/2020)   Not Taking at Unknown time   Allergies  Allergen Reactions  . Tramadol Hives    Social History   Tobacco Use  . Smoking status: Never Smoker  . Smokeless tobacco: Never Used  Substance Use Topics  . Alcohol use: No    Family History  Problem Relation Age of Onset  . Cancer Mother      Review of Systems  Musculoskeletal: Positive for arthralgias.       Left knee  All other systems reviewed and are negative.   Objective:  Physical Exam Constitutional:  Appearance: Normal appearance.  HENT:     Head: Normocephalic and atraumatic.     Nose: Nose normal.     Mouth/Throat:     Pharynx: Oropharynx is clear.  Eyes:     Extraocular Movements: Extraocular movements intact.  Cardiovascular:     Rate and Rhythm: Normal rate and regular rhythm.  Pulmonary:     Effort: Pulmonary effort is normal.  Abdominal:     Palpations: Abdomen is soft.   Musculoskeletal:     Comments: Both knees move about 0-120.  He has medial joint line pain and crepitation on both.  Leg lengths are equal.  Hip motion is full and straight leg raise is negative on both sides.  Sensation and motor function are intact in his feet with palpable pulses on both sides.    Skin:    General: Skin is warm and dry.  Neurological:     General: No focal deficit present.     Mental Status: He is alert and oriented to person, place, and time.  Psychiatric:        Mood and Affect: Mood normal.        Behavior: Behavior normal.        Thought Content: Thought content normal.        Judgment: Judgment normal.     Vital signs in last 24 hours: BP: ()/()  Arterial Line BP: ()/()   Labs:   Estimated body mass index is 35.79 kg/m as calculated from the following:   Height as of 12/16/20: 5\' 8"  (1.727 m).   Weight as of 12/16/20: 106.8 kg.   Imaging Review Plain radiographs demonstrate severe degenerative joint disease of the left knee(s). The overall alignment isneutral. The bone quality appears to be good for age and reported activity level.      Assessment/Plan:  End stage primary arthritis, left knee   The patient history, physical examination, clinical judgment of the provider and imaging studies are consistent with end stage degenerative joint disease of the left knee(s) and total knee arthroplasty is deemed medically necessary. The treatment options including medical management, injection therapy arthroscopy and arthroplasty were discussed at length. The risks and benefits of total knee arthroplasty were presented and reviewed. The risks due to aseptic loosening, infection, stiffness, patella tracking problems, thromboembolic complications and other imponderables were discussed. The patient acknowledged the explanation, agreed to proceed with the plan and consent was signed. Patient is being admitted for inpatient treatment for surgery, pain control, PT, OT,  prophylactic antibiotics, VTE prophylaxis, progressive ambulation and ADL's and discharge planning. The patient is planning to be discharged home with home health services     Patient's anticipated LOS is less than 2 midnights, meeting these requirements: - Younger than 48 - Lives within 1 hour of care - Has a competent adult at home to recover with post-op recover - NO history of  - Chronic pain requiring opiods  - Diabetes  - Coronary Artery Disease  - Heart failure  - Heart attack  - Stroke  - DVT/VTE  - Cardiac arrhythmia  - Respiratory Failure/COPD  - Renal failure  - Anemia  - Advanced Liver disease

## 2021-01-05 ENCOUNTER — Encounter (HOSPITAL_COMMUNITY)
Admission: RE | Admit: 2021-01-05 | Discharge: 2021-01-05 | Disposition: A | Discharge status: Home / Self Care | Payer: 59 | Source: Ambulatory Visit | Attending: Orthopaedic Surgery | Admitting: Orthopaedic Surgery

## 2021-01-05 ENCOUNTER — Other Ambulatory Visit: Payer: Self-pay

## 2021-01-05 ENCOUNTER — Ambulatory Visit (HOSPITAL_COMMUNITY)
Admission: RE | Admit: 2021-01-05 | Discharge: 2021-01-05 | Disposition: A | Discharge status: Home / Self Care | Payer: 59 | Source: Ambulatory Visit | Attending: Orthopaedic Surgery | Admitting: Orthopaedic Surgery

## 2021-01-05 ENCOUNTER — Encounter (HOSPITAL_COMMUNITY): Payer: Self-pay

## 2021-01-05 DIAGNOSIS — Z01818 Encounter for other preprocedural examination: Secondary | ICD-10-CM

## 2021-01-05 HISTORY — DX: Personal history of urinary calculi: Z87.442

## 2021-01-05 HISTORY — DX: Unspecified osteoarthritis, unspecified site: M19.90

## 2021-01-05 HISTORY — DX: Gastro-esophageal reflux disease without esophagitis: K21.9

## 2021-01-05 LAB — CBC WITH DIFFERENTIAL/PLATELET
Abs Immature Granulocytes: 0.04 10*3/uL (ref 0.00–0.07)
Basophils Absolute: 0.1 10*3/uL (ref 0.0–0.1)
Basophils Relative: 1 %
Eosinophils Absolute: 0.3 10*3/uL (ref 0.0–0.5)
Eosinophils Relative: 2 %
HCT: 42.1 % (ref 39.0–52.0)
Hemoglobin: 13.7 g/dL (ref 13.0–17.0)
Immature Granulocytes: 0 %
Lymphocytes Relative: 25 %
Lymphs Abs: 2.8 10*3/uL (ref 0.7–4.0)
MCH: 28.5 pg (ref 26.0–34.0)
MCHC: 32.5 g/dL (ref 30.0–36.0)
MCV: 87.5 fL (ref 80.0–100.0)
Monocytes Absolute: 0.7 10*3/uL (ref 0.1–1.0)
Monocytes Relative: 7 %
Neutro Abs: 7.1 10*3/uL (ref 1.7–7.7)
Neutrophils Relative %: 65 %
Platelets: 329 10*3/uL (ref 150–400)
RBC: 4.81 MIL/uL (ref 4.22–5.81)
RDW: 13 % (ref 11.5–15.5)
WBC: 11 10*3/uL — ABNORMAL HIGH (ref 4.0–10.5)
nRBC: 0 % (ref 0.0–0.2)

## 2021-01-05 LAB — URINALYSIS, ROUTINE W REFLEX MICROSCOPIC
Bilirubin Urine: NEGATIVE
Glucose, UA: NEGATIVE mg/dL
Hgb urine dipstick: NEGATIVE
Ketones, ur: NEGATIVE mg/dL
Leukocytes,Ua: NEGATIVE
Nitrite: NEGATIVE
Protein, ur: NEGATIVE mg/dL
Specific Gravity, Urine: 1.017 (ref 1.005–1.030)
pH: 5 (ref 5.0–8.0)

## 2021-01-05 LAB — BASIC METABOLIC PANEL
Anion gap: 10 (ref 5–15)
BUN: 24 mg/dL — ABNORMAL HIGH (ref 6–20)
CO2: 25 mmol/L (ref 22–32)
Calcium: 9.2 mg/dL (ref 8.9–10.3)
Chloride: 102 mmol/L (ref 98–111)
Creatinine, Ser: 1.01 mg/dL (ref 0.61–1.24)
GFR, Estimated: >60 mL/min (ref >60)
Glucose, Bld: 121 mg/dL — ABNORMAL HIGH (ref 70–99)
Potassium: 4.2 mmol/L (ref 3.5–5.1)
Sodium: 137 mmol/L (ref 135–145)

## 2021-01-05 LAB — GLUCOSE, CAPILLARY: Glucose-Capillary: 134 mg/dL — ABNORMAL HIGH (ref 70–99)

## 2021-01-05 LAB — PROTIME-INR
INR: 0.9 (ref 0.8–1.2)
Prothrombin Time: 12.5 seconds (ref 11.4–15.2)

## 2021-01-05 LAB — SURGICAL PCR SCREEN
MRSA, PCR: NEGATIVE
Staphylococcus aureus: NEGATIVE

## 2021-01-05 LAB — APTT: aPTT: 30 seconds (ref 24–36)

## 2021-01-05 NOTE — Progress Notes (Signed)
   01/05/21 1307  OBSTRUCTIVE SLEEP APNEA  Have you ever been diagnosed with sleep apnea through a sleep study? No  Do you snore loudly (loud enough to be heard through closed doors)?  0  Do you often feel tired, fatigued, or sleepy during the daytime (such as falling asleep during driving or talking to someone)? 0  Has anyone observed you stop breathing during your sleep? 0  Do you have, or are you being treated for high blood pressure? 1  BMI more than 35 kg/m2? 1  Age > 50 (1-yes) 1  Neck circumference greater than:Male 16 inches or larger, Male 17inches or larger? 1  Male Gender (Yes=1) 1  Obstructive Sleep Apnea Score 5  Score 5 or greater  Results sent to PCP

## 2021-01-06 ENCOUNTER — Encounter: Payer: Self-pay | Admitting: Family Medicine

## 2021-01-06 ENCOUNTER — Other Ambulatory Visit (HOSPITAL_COMMUNITY)
Admission: RE | Admit: 2021-01-06 | Discharge: 2021-01-06 | Disposition: A | Discharge status: Home / Self Care | Payer: 59 | Source: Ambulatory Visit | Attending: Orthopaedic Surgery | Admitting: Orthopaedic Surgery

## 2021-01-06 DIAGNOSIS — Z20822 Contact with and (suspected) exposure to covid-19: Secondary | ICD-10-CM | POA: Insufficient documentation

## 2021-01-06 DIAGNOSIS — Z01812 Encounter for preprocedural laboratory examination: Secondary | ICD-10-CM | POA: Insufficient documentation

## 2021-01-06 LAB — SARS CORONAVIRUS 2 (TAT 6-24 HRS): SARS Coronavirus 2: NEGATIVE

## 2021-01-09 MED ORDER — BUPIVACAINE LIPOSOME 1.3 % IJ SUSP
20.0000 mL | Freq: Once | INTRAMUSCULAR | Status: DC
  Filled 2021-01-09: qty 20

## 2021-01-09 MED ORDER — TRANEXAMIC ACID 1000 MG/10ML IV SOLN
2000.0000 mg | INTRAVENOUS | Status: DC
  Filled 2021-01-09: qty 20

## 2021-01-10 ENCOUNTER — Ambulatory Visit (HOSPITAL_COMMUNITY): Payer: 59 | Admitting: Physician Assistant

## 2021-01-10 ENCOUNTER — Encounter (HOSPITAL_COMMUNITY): Payer: Self-pay | Admitting: Orthopaedic Surgery

## 2021-01-10 ENCOUNTER — Ambulatory Visit (HOSPITAL_COMMUNITY): Payer: 59 | Admitting: Certified Registered Nurse Anesthetist

## 2021-01-10 ENCOUNTER — Encounter (HOSPITAL_COMMUNITY)
Admission: RE | Disposition: A | Discharge status: Home / Self Care | Payer: Self-pay | Source: Home / Self Care | Attending: Orthopaedic Surgery

## 2021-01-10 ENCOUNTER — Ambulatory Visit (HOSPITAL_COMMUNITY)
Admission: RE | Admit: 2021-01-10 | Discharge: 2021-01-10 | Disposition: A | Discharge status: Home / Self Care | Payer: 59 | Attending: Orthopaedic Surgery | Admitting: Orthopaedic Surgery

## 2021-01-10 DIAGNOSIS — Z79899 Other long term (current) drug therapy: Secondary | ICD-10-CM | POA: Insufficient documentation

## 2021-01-10 DIAGNOSIS — Z809 Family history of malignant neoplasm, unspecified: Secondary | ICD-10-CM | POA: Insufficient documentation

## 2021-01-10 DIAGNOSIS — E119 Type 2 diabetes mellitus without complications: Secondary | ICD-10-CM | POA: Insufficient documentation

## 2021-01-10 DIAGNOSIS — Z7984 Long term (current) use of oral hypoglycemic drugs: Secondary | ICD-10-CM | POA: Insufficient documentation

## 2021-01-10 DIAGNOSIS — I110 Hypertensive heart disease with heart failure: Secondary | ICD-10-CM | POA: Insufficient documentation

## 2021-01-10 DIAGNOSIS — Z885 Allergy status to narcotic agent status: Secondary | ICD-10-CM | POA: Insufficient documentation

## 2021-01-10 DIAGNOSIS — M1712 Unilateral primary osteoarthritis, left knee: Secondary | ICD-10-CM | POA: Insufficient documentation

## 2021-01-10 DIAGNOSIS — Z01818 Encounter for other preprocedural examination: Secondary | ICD-10-CM

## 2021-01-10 DIAGNOSIS — Z888 Allergy status to other drugs, medicaments and biological substances status: Secondary | ICD-10-CM | POA: Insufficient documentation

## 2021-01-10 DIAGNOSIS — I5030 Unspecified diastolic (congestive) heart failure: Secondary | ICD-10-CM | POA: Insufficient documentation

## 2021-01-10 DIAGNOSIS — E785 Hyperlipidemia, unspecified: Secondary | ICD-10-CM | POA: Insufficient documentation

## 2021-01-10 HISTORY — PX: TOTAL KNEE ARTHROPLASTY: SHX125

## 2021-01-10 LAB — GLUCOSE, CAPILLARY
Glucose-Capillary: 151 mg/dL — ABNORMAL HIGH (ref 70–99)
Glucose-Capillary: 111 mg/dL — ABNORMAL HIGH (ref 70–99)

## 2021-01-10 LAB — ABO/RH: ABO/RH(D): O POS

## 2021-01-10 LAB — TYPE AND SCREEN
ABO/RH(D): O POS
Antibody Screen: NEGATIVE

## 2021-01-10 SURGERY — ARTHROPLASTY, KNEE, TOTAL
Anesthesia: Spinal | Site: Knee | Laterality: Left

## 2021-01-10 MED ORDER — CHLORHEXIDINE GLUCONATE 0.12 % MT SOLN
15.0000 mL | Freq: Once | OROMUCOSAL | Status: AC
  Administered 2021-01-10: 15 mL via OROMUCOSAL

## 2021-01-10 MED ORDER — OXYCODONE HCL 5 MG PO TABS
ORAL_TABLET | ORAL | Status: AC
  Filled 2021-01-10: qty 1

## 2021-01-10 MED ORDER — BUPIVACAINE-EPINEPHRINE (PF) 0.25% -1:200000 IJ SOLN
INTRAMUSCULAR | Status: AC
  Filled 2021-01-10: qty 30

## 2021-01-10 MED ORDER — MIDAZOLAM HCL 5 MG/5ML IJ SOLN
INTRAMUSCULAR | Status: DC | PRN
  Administered 2021-01-10: 2 mg via INTRAVENOUS

## 2021-01-10 MED ORDER — MORPHINE SULFATE (PF) 4 MG/ML IV SOLN: 0.5000 mg | INTRAVENOUS | Status: DC | PRN

## 2021-01-10 MED ORDER — LACTATED RINGERS IV BOLUS
250.0000 mL | Freq: Once | INTRAVENOUS | Status: AC
  Administered 2021-01-10: 250 mL via INTRAVENOUS

## 2021-01-10 MED ORDER — OXYCODONE HCL 5 MG/5ML PO SOLN: 5.0000 mg | Freq: Once | ORAL | Status: AC | PRN

## 2021-01-10 MED ORDER — TRANEXAMIC ACID-NACL 1000-0.7 MG/100ML-% IV SOLN
1000.0000 mg | Freq: Once | INTRAVENOUS | Status: AC
  Administered 2021-01-10: 1000 mg via INTRAVENOUS

## 2021-01-10 MED ORDER — PROMETHAZINE HCL 25 MG/ML IJ SOLN: 6.2500 mg | INTRAMUSCULAR | Status: DC | PRN

## 2021-01-10 MED ORDER — ROPIVACAINE HCL 5 MG/ML IJ SOLN
INTRAMUSCULAR | Status: DC | PRN
  Administered 2021-01-10: 20 mL via PERINEURAL

## 2021-01-10 MED ORDER — HYDROCODONE-ACETAMINOPHEN 7.5-325 MG PO TABS
1.0000 | ORAL_TABLET | ORAL | Status: DC | PRN
  Administered 2021-01-10 (×2): 1 via ORAL

## 2021-01-10 MED ORDER — HYDROCODONE-ACETAMINOPHEN 7.5-325 MG PO TABS
ORAL_TABLET | ORAL | Status: AC
  Filled 2021-01-10: qty 1

## 2021-01-10 MED ORDER — HYDROCODONE-ACETAMINOPHEN 5-325 MG PO TABS: 1.0000 | ORAL_TABLET | Freq: Four times a day (QID) | ORAL | 0 refills | Status: DC | PRN

## 2021-01-10 MED ORDER — SODIUM CHLORIDE (PF) 0.9 % IJ SOLN
INTRAMUSCULAR | Status: DC | PRN
  Administered 2021-01-10: 30 mL

## 2021-01-10 MED ORDER — BUPIVACAINE HCL (PF) 0.75 % IJ SOLN
INTRAMUSCULAR | Status: DC | PRN
  Administered 2021-01-10: 1.8 mL via INTRATHECAL

## 2021-01-10 MED ORDER — LACTATED RINGERS IV BOLUS
500.0000 mL | Freq: Once | INTRAVENOUS | Status: AC
  Administered 2021-01-10: 500 mL via INTRAVENOUS

## 2021-01-10 MED ORDER — TRANEXAMIC ACID-NACL 1000-0.7 MG/100ML-% IV SOLN: 1000.0000 mg | INTRAVENOUS | Status: DC

## 2021-01-10 MED ORDER — CEFAZOLIN SODIUM-DEXTROSE 2-4 GM/100ML-% IV SOLN: 2.0000 g | INTRAVENOUS | Status: DC

## 2021-01-10 MED ORDER — LACTATED RINGERS IV SOLN: INTRAVENOUS | Status: DC

## 2021-01-10 MED ORDER — PROPOFOL 500 MG/50ML IV EMUL
INTRAVENOUS | Status: DC | PRN
  Administered 2021-01-10: 100 ug/kg/min via INTRAVENOUS

## 2021-01-10 MED ORDER — HYDROMORPHONE HCL 1 MG/ML IJ SOLN: 0.2500 mg | INTRAMUSCULAR | Status: DC | PRN

## 2021-01-10 MED ORDER — ORAL CARE MOUTH RINSE: 15.0000 mL | Freq: Once | OROMUCOSAL | Status: AC

## 2021-01-10 MED ORDER — ACETAMINOPHEN 325 MG PO TABS: 325.0000 mg | ORAL_TABLET | Freq: Four times a day (QID) | ORAL | Status: DC | PRN

## 2021-01-10 MED ORDER — PHENYLEPHRINE 40 MCG/ML (10ML) SYRINGE FOR IV PUSH (FOR BLOOD PRESSURE SUPPORT)
PREFILLED_SYRINGE | INTRAVENOUS | Status: AC
  Filled 2021-01-10: qty 10

## 2021-01-10 MED ORDER — CEFAZOLIN SODIUM-DEXTROSE 2-4 GM/100ML-% IV SOLN
INTRAVENOUS | Status: AC
  Filled 2021-01-10: qty 100

## 2021-01-10 MED ORDER — TRANEXAMIC ACID-NACL 1000-0.7 MG/100ML-% IV SOLN
INTRAVENOUS | Status: AC
  Filled 2021-01-10: qty 100

## 2021-01-10 MED ORDER — TRANEXAMIC ACID 1000 MG/10ML IV SOLN: 2000.0000 mg | INTRAVENOUS | Status: DC

## 2021-01-10 MED ORDER — METHOCARBAMOL 500 MG PO TABS
500.0000 mg | ORAL_TABLET | Freq: Four times a day (QID) | ORAL | Status: DC | PRN
  Administered 2021-01-10: 500 mg via ORAL

## 2021-01-10 MED ORDER — METHOCARBAMOL 500 MG IVPB - SIMPLE MED: 500.0000 mg | Freq: Four times a day (QID) | INTRAVENOUS | Status: DC | PRN

## 2021-01-10 MED ORDER — PROPOFOL 1000 MG/100ML IV EMUL
INTRAVENOUS | Status: AC
  Filled 2021-01-10: qty 100

## 2021-01-10 MED ORDER — SODIUM CHLORIDE (PF) 0.9 % IJ SOLN
INTRAMUSCULAR | Status: AC
  Filled 2021-01-10: qty 30

## 2021-01-10 MED ORDER — BUPIVACAINE LIPOSOME 1.3 % IJ SUSP
20.0000 mL | Freq: Once | INTRAMUSCULAR | Status: DC
  Filled 2021-01-10: qty 20

## 2021-01-10 MED ORDER — HYDROCODONE-ACETAMINOPHEN 5-325 MG PO TABS: 1.0000 | ORAL_TABLET | ORAL | Status: DC | PRN

## 2021-01-10 MED ORDER — SODIUM CHLORIDE 0.9 % IR SOLN
Status: DC | PRN
  Administered 2021-01-10: 3000 mL

## 2021-01-10 MED ORDER — TRANEXAMIC ACID 1000 MG/10ML IV SOLN
INTRAVENOUS | Status: DC | PRN
  Administered 2021-01-10: 2000 mg via TOPICAL

## 2021-01-10 MED ORDER — 0.9 % SODIUM CHLORIDE (POUR BTL) OPTIME
TOPICAL | Status: DC | PRN
  Administered 2021-01-10: 1000 mL

## 2021-01-10 MED ORDER — DEXAMETHASONE SODIUM PHOSPHATE 10 MG/ML IJ SOLN
INTRAMUSCULAR | Status: DC | PRN
  Administered 2021-01-10: 5 mg via INTRAVENOUS

## 2021-01-10 MED ORDER — METHOCARBAMOL 500 MG PO TABS
ORAL_TABLET | ORAL | Status: AC
  Filled 2021-01-10: qty 1

## 2021-01-10 MED ORDER — KETOROLAC TROMETHAMINE 15 MG/ML IJ SOLN
INTRAMUSCULAR | Status: AC
  Filled 2021-01-10: qty 1

## 2021-01-10 MED ORDER — POVIDONE-IODINE 10 % EX SWAB
2.0000 "application " | Freq: Once | CUTANEOUS | Status: AC
  Administered 2021-01-10: 2 via TOPICAL

## 2021-01-10 MED ORDER — PROPOFOL 10 MG/ML IV BOLUS
INTRAVENOUS | Status: DC | PRN
  Administered 2021-01-10 (×2): 20 mg via INTRAVENOUS

## 2021-01-10 MED ORDER — TIZANIDINE HCL 4 MG PO TABS: 4.0000 mg | ORAL_TABLET | Freq: Four times a day (QID) | ORAL | 1 refills | Status: AC | PRN

## 2021-01-10 MED ORDER — BUPIVACAINE LIPOSOME 1.3 % IJ SUSP
INTRAMUSCULAR | Status: DC | PRN
  Administered 2021-01-10: 20 mL

## 2021-01-10 MED ORDER — FENTANYL CITRATE (PF) 100 MCG/2ML IJ SOLN
INTRAMUSCULAR | Status: AC
  Filled 2021-01-10: qty 2

## 2021-01-10 MED ORDER — TRANEXAMIC ACID-NACL 1000-0.7 MG/100ML-% IV SOLN
1000.0000 mg | INTRAVENOUS | Status: AC
  Administered 2021-01-10: 1000 mg via INTRAVENOUS
  Filled 2021-01-10: qty 100

## 2021-01-10 MED ORDER — CEFAZOLIN SODIUM-DEXTROSE 2-4 GM/100ML-% IV SOLN
2.0000 g | INTRAVENOUS | Status: AC
  Administered 2021-01-10: 2 g via INTRAVENOUS
  Filled 2021-01-10: qty 100

## 2021-01-10 MED ORDER — OXYCODONE HCL 5 MG PO TABS
5.0000 mg | ORAL_TABLET | Freq: Once | ORAL | Status: AC | PRN
  Administered 2021-01-10: 5 mg via ORAL

## 2021-01-10 MED ORDER — BUPIVACAINE-EPINEPHRINE 0.5% -1:200000 IJ SOLN
INTRAMUSCULAR | Status: DC | PRN
  Administered 2021-01-10: 30 mL

## 2021-01-10 MED ORDER — KETOROLAC TROMETHAMINE 15 MG/ML IJ SOLN
15.0000 mg | Freq: Four times a day (QID) | INTRAMUSCULAR | Status: DC
  Administered 2021-01-10: 15 mg via INTRAVENOUS

## 2021-01-10 MED ORDER — MIDAZOLAM HCL 2 MG/2ML IJ SOLN
INTRAMUSCULAR | Status: AC
  Filled 2021-01-10: qty 2

## 2021-01-10 MED ORDER — CEFAZOLIN SODIUM-DEXTROSE 2-4 GM/100ML-% IV SOLN
2.0000 g | Freq: Four times a day (QID) | INTRAVENOUS | Status: DC
Start: 2021-01-10 — End: 2021-01-10
  Administered 2021-01-10: 2 g via INTRAVENOUS

## 2021-01-10 MED ORDER — FENTANYL CITRATE (PF) 100 MCG/2ML IJ SOLN
INTRAMUSCULAR | Status: DC | PRN
  Administered 2021-01-10 (×2): 50 ug via INTRAVENOUS

## 2021-01-10 MED ORDER — PHENYLEPHRINE 40 MCG/ML (10ML) SYRINGE FOR IV PUSH (FOR BLOOD PRESSURE SUPPORT)
PREFILLED_SYRINGE | INTRAVENOUS | Status: DC | PRN
  Administered 2021-01-10 (×3): 80 ug via INTRAVENOUS

## 2021-01-10 MED ORDER — POVIDONE-IODINE 10 % EX SWAB: 2.0000 "application " | Freq: Once | CUTANEOUS | Status: DC

## 2021-01-10 SURGICAL SUPPLY — 55 items
ATTUNE MED DOME PAT 38 KNEE (Knees) ×1 IMPLANT
ATTUNE PS FEM LT SZ 5 CEM KNEE (Femur) ×1 IMPLANT
ATTUNE PSRP INSE SZ5 7 KNEE (Insert) ×1 IMPLANT
BAG DECANTER FOR FLEXI CONT (MISCELLANEOUS) ×2 IMPLANT
BAG SPEC THK2 15X12 ZIP CLS (MISCELLANEOUS) ×1
BAG ZIPLOCK 12X15 (MISCELLANEOUS) ×2 IMPLANT
BASE TIBIA ATTUNE KNEE SYS SZ6 (Knees) IMPLANT
BLADE SAGITTAL 25.0X1.19X90 (BLADE) ×2 IMPLANT
BLADE SAW SGTL 11.0X1.19X90.0M (BLADE) ×2 IMPLANT
BNDG ELASTIC 6X5.8 VLCR STR LF (GAUZE/BANDAGES/DRESSINGS) ×2 IMPLANT
BOOTIES KNEE HIGH SLOAN (MISCELLANEOUS) ×2 IMPLANT
BOWL SMART MIX CTS (DISPOSABLE) ×2 IMPLANT
BSPLAT TIB 6 CMNT ROT PLAT STR (Knees) ×1 IMPLANT
CEMENT HV SMART SET (Cement) ×3 IMPLANT
COVER SURGICAL LIGHT HANDLE (MISCELLANEOUS) ×2 IMPLANT
COVER WAND RF STERILE (DRAPES) ×2 IMPLANT
CUFF TOURN SGL QUICK 34 (TOURNIQUET CUFF) ×2
CUFF TRNQT CYL 34X4.125X (TOURNIQUET CUFF) ×1 IMPLANT
DECANTER SPIKE VIAL GLASS SM (MISCELLANEOUS) ×4 IMPLANT
DRAPE ORTHO SPLIT 77X108 STRL (DRAPES)
DRAPE SHEET LG 3/4 BI-LAMINATE (DRAPES) ×2 IMPLANT
DRAPE SURG ORHT 6 SPLT 77X108 (DRAPES) IMPLANT
DRAPE TOP 10253 STERILE (DRAPES) ×2 IMPLANT
DRAPE U-SHAPE 47X51 STRL (DRAPES) ×2 IMPLANT
DRSG AQUACEL AG ADV 3.5X10 (GAUZE/BANDAGES/DRESSINGS) ×2 IMPLANT
DURAPREP 26ML APPLICATOR (WOUND CARE) ×4 IMPLANT
ELECT REM PT RETURN 15FT ADLT (MISCELLANEOUS) ×2 IMPLANT
GLOVE SRG 8 PF TXTR STRL LF DI (GLOVE) ×2 IMPLANT
GLOVE SURG ENC MOIS LTX SZ8 (GLOVE) ×4 IMPLANT
GLOVE SURG UNDER POLY LF SZ8 (GLOVE) ×4
GOWN STRL REUS W/TWL XL LVL3 (GOWN DISPOSABLE) ×4 IMPLANT
HANDPIECE INTERPULSE COAX TIP (DISPOSABLE) ×2
HOLDER FOLEY CATH W/STRAP (MISCELLANEOUS) IMPLANT
HOOD PEEL AWAY FLYTE STAYCOOL (MISCELLANEOUS) ×6 IMPLANT
KIT TURNOVER KIT A (KITS) ×2 IMPLANT
MANIFOLD NEPTUNE II (INSTRUMENTS) ×2 IMPLANT
NEEDLE HYPO 22GX1.5 SAFETY (NEEDLE) ×1 IMPLANT
NS IRRIG 1000ML POUR BTL (IV SOLUTION) ×2 IMPLANT
PACK TOTAL KNEE CUSTOM (KITS) ×2 IMPLANT
PAD ARMBOARD 7.5X6 YLW CONV (MISCELLANEOUS) ×2 IMPLANT
PENCIL SMOKE EVACUATOR (MISCELLANEOUS) IMPLANT
PIN DRILL FIX HALF THREAD (BIT) ×1 IMPLANT
PIN STEINMAN FIXATION KNEE (PIN) ×1 IMPLANT
PROTECTOR NERVE ULNAR (MISCELLANEOUS) ×2 IMPLANT
SET HNDPC FAN SPRY TIP SCT (DISPOSABLE) ×1 IMPLANT
SUT ETHIBOND NAB CT1 #1 30IN (SUTURE) ×4 IMPLANT
SUT VIC AB 0 CT1 36 (SUTURE) ×2 IMPLANT
SUT VIC AB 2-0 CT1 27 (SUTURE) ×2
SUT VIC AB 2-0 CT1 TAPERPNT 27 (SUTURE) ×1 IMPLANT
SUT VICRYL AB 3-0 FS1 BRD 27IN (SUTURE) ×2 IMPLANT
SUT VLOC 180 0 24IN GS25 (SUTURE) ×2 IMPLANT
TIBIA ATTUNE KNEE SYS BASE SZ6 (Knees) ×2 IMPLANT
TRAY FOLEY MTR SLVR 16FR STAT (SET/KITS/TRAYS/PACK) IMPLANT
WATER STERILE IRR 1000ML POUR (IV SOLUTION) ×2 IMPLANT
WRAP KNEE MAXI GEL POST OP (GAUZE/BANDAGES/DRESSINGS) ×2 IMPLANT

## 2021-01-10 NOTE — Anesthesia Postprocedure Evaluation (Signed)
Anesthesia Post Note  Patient: Donald Cole  Procedure(s) Performed: LEFT TOTAL KNEE ARTHROPLASTY (Left Knee)     Patient location during evaluation: PACU Anesthesia Type: Spinal Level of consciousness: awake and alert Pain management: pain level controlled Vital Signs Assessment: post-procedure vital signs reviewed and stable Respiratory status: spontaneous breathing, nonlabored ventilation and respiratory function stable Cardiovascular status: blood pressure returned to baseline and stable Postop Assessment: no apparent nausea or vomiting Anesthetic complications: no   No complications documented.  Last Vitals:  Vitals:   01/10/21 1100 01/10/21 1107  BP: 123/89 (!) 136/97  Pulse: (!) 54 (!) 48  Resp: 19 16  Temp:  36.5 C  SpO2: 98% 95%    Last Pain:  Vitals:   01/10/21 1100  TempSrc:   PainSc: 0-No pain                 Lowella Curb

## 2021-01-10 NOTE — Transfer of Care (Signed)
Immediate Anesthesia Transfer of Care Note  Patient: KAINAN PATTY  Procedure(s) Performed: LEFT TOTAL KNEE ARTHROPLASTY (Left Knee)  Patient Location: PACU  Anesthesia Type:Spinal  Level of Consciousness: awake and patient cooperative  Airway & Oxygen Therapy: Patient Spontanous Breathing and Patient connected to face mask  Post-op Assessment: Report given to RN and Post -op Vital signs reviewed and stable  Post vital signs: Reviewed and stable  Last Vitals:  Vitals Value Taken Time  BP 121/91 01/10/21 0935  Temp    Pulse 62 01/10/21 0936  Resp 17 01/10/21 0936  SpO2 98 % 01/10/21 0936  Vitals shown include unvalidated device data.  Last Pain:  Vitals:   01/10/21 0558  TempSrc:   PainSc: 0-No pain         Complications: No complications documented.

## 2021-01-10 NOTE — Interval H&P Note (Signed)
History and Physical Interval Note:  01/10/2021 7:24 AM  [name]  has presented today for surgery, with the diagnosis of LEFT KNEE DEGENERATIVE JOINT DISEASE.  The various methods of treatment have been discussed with the patient and family. After consideration of risks, benefits and other options for treatment, the patient has consented to  Procedure(s): LEFT TOTAL KNEE ARTHROPLASTY (Left) as a surgical intervention.  The patient's history has been reviewed, patient examined, no change in status, stable for surgery.  I have reviewed the patient's chart and labs.  Questions were answered to the patient's satisfaction.     Donald Cole

## 2021-01-10 NOTE — Anesthesia Preprocedure Evaluation (Signed)
Anesthesia Evaluation  Patient identified by MRN, date of birth, ID band Patient awake    Reviewed: Allergy & Precautions, NPO status , Patient's Chart, lab work & pertinent test results  History of Anesthesia Complications Negative for: history of anesthetic complications  Airway Mallampati: II  TM Distance: >3 FB Neck ROM: Full    Dental  (+) Partial Upper   Pulmonary neg pulmonary ROS,    Pulmonary exam normal        Cardiovascular hypertension, Pt. on medications Normal cardiovascular exam     Neuro/Psych negative neurological ROS  negative psych ROS   GI/Hepatic Neg liver ROS, GERD  ,  Endo/Other  negative endocrine ROSdiabetes, Type 2, Oral Hypoglycemic Agents  Renal/GU negative Renal ROS  negative genitourinary   Musculoskeletal  (+) Arthritis , Osteoarthritis,    Abdominal (+) + obese,   Peds  Hematology negative hematology ROS (+)   Anesthesia Other Findings   Reproductive/Obstetrics                             Anesthesia Physical  Anesthesia Plan  ASA: III  Anesthesia Plan: Spinal   Post-op Pain Management:  Regional for Post-op pain   Induction: Intravenous  PONV Risk Score and Plan: Ondansetron and Treatment may vary due to age or medical condition  Airway Management Planned: Simple Face Mask  Additional Equipment: None  Intra-op Plan:   Post-operative Plan:   Informed Consent: I have reviewed the patients History and Physical, chart, labs and discussed the procedure including the risks, benefits and alternatives for the proposed anesthesia with the patient or authorized representative who has indicated his/her understanding and acceptance.     Dental advisory given  Plan Discussed with:   Anesthesia Plan Comments:         Anesthesia Quick Evaluation

## 2021-01-10 NOTE — Anesthesia Procedure Notes (Signed)
Spinal  Patient location during procedure: OR Start time: 01/10/2021 7:30 AM End time: 01/10/2021 7:35 AM Reason for block: surgical anesthesia Staffing Performed: anesthesiologist  Anesthesiologist: Lowella Curb, MD Preanesthetic Checklist Completed: patient identified, IV checked, site marked, risks and benefits discussed, surgical consent, monitors and equipment checked, pre-op evaluation and timeout performed Spinal Block Patient position: sitting Prep: DuraPrep Patient monitoring: heart rate, cardiac monitor, continuous pulse ox and blood pressure Approach: midline Location: L3-4 Injection technique: single-shot Needle Needle type: Quincke  Needle gauge: 22 G Needle length: 9 cm Assessment Sensory level: T4 Events: CSF return and second provider Additional Notes First attempt by CRNA with 24g Pencan

## 2021-01-10 NOTE — Progress Notes (Signed)
Physical Therapy Treatment Patient Details Name: Donald Cole MRN: [id number] DOB: 08-30-1961 Today's Date: 01/10/2021    History of Present Illness Patient is 60 y.o. male s/p Lt TKA on 01/10/21 with PMH significant for HTN, GERD, DM, OA.    PT Comments    Patient seen for additional session to progress mobility in effort for safe discharge home. Patient with no c/o dizziness or lightheadedness. Patient was able to complete sit<>stands and ambulate ~ 90 feet with RW and min guard/supervision and cues for safe walker management. BP stable throughout. Patient instructed in exercises to facilitate ROM and circulation. Patient will benefit from continued skilled PT interventions to address impairments and progress towards PLOF. Patient has met mobility goals at adequate level for discharge home; will continue to follow if pt continues acute stay to progress towards Mod I goals.    Follow Up Recommendations  Follow surgeon's recommendation for DC plan and follow-up therapies     Equipment Recommendations  Rolling walker with 5" wheels    Recommendations for Other Services       Precautions / Restrictions Precautions Precautions: Fall Restrictions Weight Bearing Restrictions: No Other Position/Activity Restrictions: WBAT    Mobility  Bed Mobility Overal bed mobility: Needs Assistance Bed Mobility: Supine to Sit     Supine to sit: Min guard;HOB elevated Sit to supine: Min assist   General bed mobility comments: guarding for safety, no assist or cues needed    Transfers Overall transfer level: Needs assistance Equipment used: Rolling walker (2 wheeled) Transfers: Sit to/from Stand Sit to Stand: Supervision            Ambulation/Gait Ambulation/Gait assistance: Min guard;Supervision   Assistive device: Rolling walker (2 wheeled) Gait Pattern/deviations: Step-to pattern Gait velocity: Medical sales representative    Modified  Rankin (Stroke Patients Only)       Balance Overall balance assessment: Needs assistance Sitting-balance support: Feet supported Sitting balance-Leahy Scale: Good     Standing balance support: During functional activity;Bilateral upper extremity supported Standing balance-Leahy Scale: Fair                              Cognition Arousal/Alertness: Awake/alert Behavior During Therapy: WFL for tasks assessed/performed Overall Cognitive Status: Within Functional Limits for tasks assessed                                        Exercises Total Joint Exercises Ankle Circles/Pumps: AROM;Both;20 reps;Seated Quad Sets: AROM;Left;5 reps;Seated Short Arc Quad: Left;AAROM;Other reps (comment);Seated (2) Hip ABduction/ADduction: AROM;Left;Other reps (comment);Seated (2) Straight Leg Raises: AROM;Left;Other reps (comment);Seated (2) Long Arc Quad: AROM;Left;Other reps (comment);Seated (2)    General Comments        Pertinent Vitals/Pain Pain Assessment: 0-10 Pain Score: 6  Pain Location: Lt knee Pain Descriptors / Indicators: Aching;Discomfort;Sore Pain Intervention(s): Limited activity within patient's tolerance;Monitored during session;Repositioned    Home Living Family/patient expects to be discharged to:: Private residence Living Arrangements: Other relatives Available Help at Discharge: Family (pt's godson lives with him) Type of Home: House Home Access: Level entry   Home Layout: One level Home Equipment: Environmental consultant - 4 wheels;Cane - single point;Bedside commode      Prior Function Level of Independence: Independent  PT Goals (current goals can now be found in the care plan section) Acute Rehab PT Goals 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 PT Goal Formulation: With patient Time For Goal Achievement: 01/17/21 Potential to Achieve Goals: Good Progress towards PT goals: Progressing toward goals    Frequency     7X/week      PT Plan Current plan remains appropriate    Co-evaluation              AM-PAC PT "6 Clicks" Mobility   Outcome Measure  Help needed turning from your back to your side while in a flat bed without using bedrails?: A Little Help needed moving from lying on your back to sitting on the side of a flat bed without using bedrails?: A Little Help needed moving to and from a bed to a chair (including a wheelchair)?: A Little Help needed standing up from a chair using your arms (e.g., wheelchair or bedside chair)?: A Lot Help needed to walk in hospital room?: A Lot Help needed climbing 3-5 steps with a railing? : A Lot 6 Click Score: 15    End of Session Equipment Utilized During Treatment: Gait belt Activity Tolerance: Other (comment) (orthostatic hypotension) Patient left: in bed;with call bell/phone within reach Nurse Communication: Mobility status PT Visit Diagnosis: Muscle weakness (generalized) (M62.81);Difficulty in walking, not elsewhere classified (R26.2);Pain Pain - Right/Left: Left Pain - part of body: Knee     Time: 9326-7124 PT Time Calculation (min) (ACUTE ONLY): 33 min  Charges:  $Gait Training: 8-22 mins $Therapeutic Exercise: 8-22 mins                     Verner Mould, DPT Acute Rehabilitation Services Office [phone] Pager [phone]     Jacques Navy 01/10/2021, 4:28 PM

## 2021-01-10 NOTE — Anesthesia Procedure Notes (Signed)
Anesthesia Regional Block: Adductor canal block   Pre-Anesthetic Checklist: ,, timeout performed, Correct Patient, Correct Site, Correct Laterality, Correct Procedure, Correct Position, site marked, Risks and benefits discussed,  Surgical consent,  Pre-op evaluation,  At surgeon's request and post-op pain management  Laterality: Left  Prep: chloraprep       Needles:  Injection technique: Single-shot  Needle Type: Stimiplex     Needle Length: 9cm  Needle Gauge: 21     Additional Needles:   Procedures:,,,, ultrasound used (permanent image in chart),,,,  Narrative:  Start time: 01/10/2021 6:58 AM End time: 01/10/2021 7:03 AM Injection made incrementally with aspirations every 5 mL.  Performed by: Personally  Anesthesiologist: Lowella Curb, MD

## 2021-01-10 NOTE — Evaluation (Signed)
Physical Therapy Evaluation Patient Details Name: Donald Cole MRN: [id number] DOB: 1961-08-05 Today's Date: 01/10/2021   History of Present Illness  Patient is 60 y.o. male s/p Lt TKA on 01/10/21 with PMH significant for HTN, GERD, DM, OA.  Clinical Impression  MACAULAY REICHER is a 60 y.o. male POD 0 s/p Lt TKA. Patient reports independence with mobility at baseline. Patient is now limited by functional impairments (see PT problem list below). Pt required min guard to sit up EOB and c/o slight lightheaded sensation. Pt successfully voided bladder and after sitting for additional 4 minutes BP dropped again (see below) and pt required min assist to return to supine where nausea and lightheadedness appeared to improve. Patient will benefit from continued skilled PT interventions to address impairments and progress towards PLOF. Acute PT will follow to progress mobility and stair training in preparation for safe discharge home.   VITALS: Time                  Position                  BP 1300                   Supine                     136/84 1321                   Seated EOB            106/72 1325                   Seated EOB             87/60 1330                   Supine                      109/66     Follow Up Recommendations Follow surgeon's recommendation for DC plan and follow-up therapies    Equipment Recommendations  Rolling walker with 5" wheels    Recommendations for Other Services       Precautions / Restrictions Precautions Precautions: Fall Restrictions Weight Bearing Restrictions: No Other Position/Activity Restrictions: WBAT      Mobility  Bed Mobility Overal bed mobility: Needs Assistance Bed Mobility: Supine to Sit;Sit to Supine     Supine to sit: Min guard;HOB elevated Sit to supine: Min assist   General bed mobility comments: guarding for safety as pt sat up EOB. pt c/o slight lightheaded feeling, BP 106/72 mmHg. pt requested to use urinal and  denied worsening of symptoms. Pt used sitting and BP assessed again now 87/60 mmhg. Min assist required to return to supine.    Transfers                    Ambulation/Gait                Stairs            Wheelchair Mobility    Modified Rankin (Stroke Patients Only)       Balance Overall balance assessment: Needs assistance Sitting-balance support: Feet supported Sitting balance-Leahy Scale: Good     Standing balance support: During functional activity;Bilateral upper extremity supported Standing balance-Leahy Scale: Fair  Pertinent Vitals/Pain Pain Assessment: 0-10 Pain Score: 6  Pain Location: Lt knee Pain Descriptors / Indicators: Aching;Discomfort;Sore Pain Intervention(s): Limited activity within patient's tolerance;Monitored during session;Premedicated before session;Repositioned;Ice applied    Home Living Family/patient expects to be discharged to:: Private residence Living Arrangements: Other relatives Available Help at Discharge: Family (pt's godson lives with him) Type of Home: House Home Access: Level entry     Home Layout: One level Home Equipment: Environmental consultant - 4 wheels;Cane - single point;Bedside commode      Prior Function Level of Independence: Independent               Hand Dominance   Dominant Hand: Right    Extremity/Trunk Assessment   Upper Extremity Assessment Upper Extremity Assessment: Overall WFL for tasks assessed    Lower Extremity Assessment Lower Extremity Assessment: LLE deficits/detail LLE Deficits / Details: good quad activation, no extensor lag with SLR LLE Sensation: WNL LLE Coordination: WNL    Cervical / Trunk Assessment Cervical / Trunk Assessment: Normal  Communication   Communication: No difficulties  Cognition Arousal/Alertness: Awake/alert Behavior During Therapy: WFL for tasks assessed/performed Overall Cognitive Status: Within Functional  Limits for tasks assessed                                        General Comments      Exercises     Assessment/Plan    PT Assessment Patient needs continued PT services  PT Problem List Decreased strength;Decreased range of motion;Decreased activity tolerance;Decreased mobility;Decreased balance;Decreased knowledge of use of DME;Decreased knowledge of precautions;Pain       PT Treatment Interventions DME instruction;Gait training;Functional mobility training;Stair training;Therapeutic activities;Therapeutic exercise;Balance training;Patient/family education    PT Goals (Current goals can be found in the Care Plan section)  Acute Rehab PT Goals 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 PT Goal Formulation: With patient Time For Goal Achievement: 01/17/21 Potential to Achieve Goals: Good    Frequency 7X/week   Barriers to discharge        Co-evaluation               AM-PAC PT "6 Clicks" Mobility  Outcome Measure Help needed turning from your back to your side while in a flat bed without using bedrails?: A Little Help needed moving from lying on your back to sitting on the side of a flat bed without using bedrails?: A Little Help needed moving to and from a bed to a chair (including a wheelchair)?: A Little Help needed standing up from a chair using your arms (e.g., wheelchair or bedside chair)?: A Lot Help needed to walk in hospital room?: A Lot Help needed climbing 3-5 steps with a railing? : A Lot 6 Click Score: 15    End of Session Equipment Utilized During Treatment: Gait belt Activity Tolerance: Other (comment) (orthostatic hypotension) Patient left: in bed;with call bell/phone within reach Nurse Communication: Mobility status PT Visit Diagnosis: Muscle weakness (generalized) (M62.81);Difficulty in walking, not elsewhere classified (R26.2);Pain Pain - Right/Left: Left Pain - part of body: Knee    Time: 9937-1696 PT Time  Calculation (min) (ACUTE ONLY): 19 min   Charges:   PT Evaluation $PT Eval Low Complexity: 1 Low          Wynn Maudlin, DPT Acute Rehabilitation Services Office [phone] Pager [phone]    Anitra Lauth 01/10/2021, 4:12 PM

## 2021-01-10 NOTE — Op Note (Signed)
PREOP DIAGNOSIS: DJD LEFT KNEE POSTOP DIAGNOSIS:  same PROCEDURE: LEFT TKR ANESTHESIA: Spinal and MAC ATTENDING SURGEON: Hessie Dibble ASSISTANT: Loni Dolly PA  INDICATIONS FOR PROCEDURE: Donald Cole is a 60 y.o. male who has struggled for a long time with pain due to degenerative arthritis of the left knee.  The patient has failed many conservative non-operative measures and at this point has pain which limits the ability to sleep and walk.  The patient is offered total knee replacement.  Informed operative consent was obtained after discussion of possible risks of anesthesia, infection, neurovascular injury, DVT, and death.  The importance of the post-operative rehabilitation protocol to optimize result was stressed extensively with the patient.  SUMMARY OF FINDINGS AND PROCEDURE:  Donald Cole was taken to the operative suite where under the above anesthesia a left knee replacement was performed.  There were advanced degenerative changes and the bone quality was excellent.  We used the DePuyAttune system and placed size 5 femur, 6 tibia, 38 mm all polyethylene patella, and a size 7 mm spacer.  Loni Dolly PA-C assisted throughout and was invaluable to the completion of the case in that he helped retract and maintain exposure while I placed the components.  He also helped close thereby minimizing OR time.  The patient was admitted for appropriate post-op care to include perioperative antibiotics and mechanical and pharmacologic measures for DVT prophylaxis.  DESCRIPTION OF PROCEDURE:  Donald Cole was taken to the operative suite where the above anesthesia was applied.  The patient was positioned supine and prepped and draped in normal sterile fashion.  An appropriate time out was performed.  After the administration of kefzol pre-op antibiotic the leg was elevated and exsanguinated and a tourniquet inflated.  A standard longitudinal incision was made on the anterior knee.   Dissection was carried down to the extensor mechanism.  All appropriate anti-infective measures were used including the pre-operative antibiotic, betadine impregnated drape, and closed hooded exhaust systems for each member of the surgical team.  A medial parapatellar incision was made in the extensor mechanism and the knee cap flipped and the knee flexed.  Some residual meniscal tissues were removed along with any remaining ACL/PCL tissue.  A guide was placed on the tibia and a flat cut was made on it's superior surface.  An intramedullary guide was placed in the femur and was utilized to make anterior and posterior cuts creating an appropriate flexion gap.  A second intramedullary guide was placed in the femur to make a distal cut properly balancing the knee with an extension gap equal to the flexion gap.  The three bones sized to the above mentioned sizes and the appropriate guides were placed and utilized.  A trial reduction was done and the knee easily came to full extension and the patella tracked well on flexion.  The trial components were removed and all bones were cleaned with pulsatile lavage and then dried thoroughly.  Cement was mixed and was pressurized onto the bones followed by placement of the aforementioned components.  Excess cement was trimmed and pressure was held on the components until the cement had hardened.  The tourniquet was deflated and a small amount of bleeding was controlled with cautery and pressure.  The knee was irrigated thoroughly.  The extensor mechanism was re-approximated with #1 ethibond in interrupted fashion.  The knee was flexed and the repair was solid.  The subcutaneous tissues were re-approximated with #0 and #2-0 vicryl and the skin  closed with a subcuticular stitch and steristrips.  A sterile dressing was applied.  Intraoperative fluids, EBL, and tourniquet time can be obtained from anesthesia records.  DISPOSITION:  The patient was taken to recovery room in stable  condition and admitted for appropriate post-op care to include peri-operative antibiotic and DVT prophylaxis with mechanical and pharmacologic measures.  Donald Cole Donald Cole 01/10/2021, 9:00 AM

## 2021-01-17 ENCOUNTER — Encounter (HOSPITAL_COMMUNITY): Payer: Self-pay | Admitting: Orthopaedic Surgery

## 2021-01-20 ENCOUNTER — Other Ambulatory Visit: Payer: Self-pay

## 2021-01-20 ENCOUNTER — Ambulatory Visit (HOSPITAL_COMMUNITY)
Admission: RE | Admit: 2021-01-20 | Discharge: 2021-01-20 | Disposition: A | Discharge status: Home / Self Care | Payer: Self-pay | Source: Ambulatory Visit | Attending: Orthopaedic Surgery | Admitting: Orthopaedic Surgery

## 2021-01-20 ENCOUNTER — Other Ambulatory Visit (HOSPITAL_COMMUNITY): Payer: Self-pay | Admitting: Orthopaedic Surgery

## 2021-01-20 DIAGNOSIS — M7989 Other specified soft tissue disorders: Secondary | ICD-10-CM | POA: Insufficient documentation

## 2021-01-20 DIAGNOSIS — M79662 Pain in left lower leg: Secondary | ICD-10-CM | POA: Insufficient documentation

## 2021-02-27 ENCOUNTER — Ambulatory Visit: Payer: 59 | Admitting: Family Medicine

## 2021-03-29 ENCOUNTER — Encounter: Payer: 59 | Admitting: Physician Assistant

## 2021-05-23 ENCOUNTER — Other Ambulatory Visit: Payer: Self-pay

## 2021-05-23 ENCOUNTER — Ambulatory Visit (HOSPITAL_COMMUNITY)
Admission: EM | Admit: 2021-05-23 | Discharge: 2021-05-23 | Disposition: A | Discharge status: Home / Self Care | Payer: 59

## 2021-12-23 ENCOUNTER — Other Ambulatory Visit: Payer: Self-pay | Admitting: Family Medicine

## 2021-12-25 ENCOUNTER — Other Ambulatory Visit: Payer: Self-pay

## 2022-01-04 ENCOUNTER — Encounter (HOSPITAL_COMMUNITY): Payer: Self-pay | Admitting: *Deleted

## 2022-01-04 ENCOUNTER — Other Ambulatory Visit: Payer: Self-pay

## 2022-01-04 ENCOUNTER — Ambulatory Visit (HOSPITAL_COMMUNITY)
Admission: EM | Admit: 2022-01-04 | Discharge: 2022-01-04 | Disposition: A | Discharge status: Home / Self Care | Payer: 59 | Attending: Internal Medicine | Admitting: Internal Medicine

## 2022-01-04 ENCOUNTER — Encounter (HOSPITAL_COMMUNITY): Payer: Self-pay

## 2022-01-04 DIAGNOSIS — R200 Anesthesia of skin: Secondary | ICD-10-CM | POA: Diagnosis not present

## 2022-01-04 DIAGNOSIS — I10 Essential (primary) hypertension: Secondary | ICD-10-CM | POA: Insufficient documentation

## 2022-01-04 DIAGNOSIS — Z760 Encounter for issue of repeat prescription: Secondary | ICD-10-CM | POA: Insufficient documentation

## 2022-01-04 DIAGNOSIS — R202 Paresthesia of skin: Secondary | ICD-10-CM | POA: Insufficient documentation

## 2022-01-04 DIAGNOSIS — E1159 Type 2 diabetes mellitus with other circulatory complications: Secondary | ICD-10-CM

## 2022-01-04 DIAGNOSIS — E1165 Type 2 diabetes mellitus with hyperglycemia: Secondary | ICD-10-CM | POA: Insufficient documentation

## 2022-01-04 LAB — COMPREHENSIVE METABOLIC PANEL
ALT: 23 U/L (ref 0–44)
AST: 24 U/L (ref 15–41)
Albumin: 3.8 g/dL (ref 3.5–5.0)
Alkaline Phosphatase: 85 U/L (ref 38–126)
Anion gap: 8 (ref 5–15)
BUN: 12 mg/dL (ref 6–20)
CO2: 25 mmol/L (ref 22–32)
Calcium: 8.6 mg/dL — ABNORMAL LOW (ref 8.9–10.3)
Chloride: 102 mmol/L (ref 98–111)
Creatinine, Ser: 1.01 mg/dL (ref 0.61–1.24)
GFR, Estimated: >60 mL/min (ref >60)
Glucose, Bld: 234 mg/dL — ABNORMAL HIGH (ref 70–99)
Potassium: 4.4 mmol/L (ref 3.5–5.1)
Sodium: 135 mmol/L (ref 135–145)
Total Bilirubin: 0.8 mg/dL (ref 0.3–1.2)
Total Protein: 7.1 g/dL (ref 6.5–8.1)

## 2022-01-04 LAB — POCT URINALYSIS DIPSTICK, ED / UC
Bilirubin Urine: NEGATIVE
Glucose, UA: 500 mg/dL — AB
Hgb urine dipstick: NEGATIVE
Leukocytes,Ua: NEGATIVE
Nitrite: NEGATIVE
Protein, ur: NEGATIVE mg/dL
Specific Gravity, Urine: 1.02 (ref 1.005–1.030)
Urobilinogen, UA: 1 mg/dL (ref 0.0–1.0)
pH: 6 (ref 5.0–8.0)

## 2022-01-04 LAB — VITAMIN B12: Vitamin B-12: 301 pg/mL (ref 180–914)

## 2022-01-04 LAB — CBC
HCT: 42 % (ref 39.0–52.0)
Hemoglobin: 14.2 g/dL (ref 13.0–17.0)
MCH: 29.2 pg (ref 26.0–34.0)
MCHC: 33.8 g/dL (ref 30.0–36.0)
MCV: 86.4 fL (ref 80.0–100.0)
Platelets: 241 10*3/uL (ref 150–400)
RBC: 4.86 MIL/uL (ref 4.22–5.81)
RDW: 12.5 % (ref 11.5–15.5)
WBC: 8.5 10*3/uL (ref 4.0–10.5)
nRBC: 0 % (ref 0.0–0.2)

## 2022-01-04 LAB — CBG MONITORING, ED: Glucose-Capillary: 234 mg/dL — ABNORMAL HIGH (ref 70–99)

## 2022-01-04 LAB — HEMOGLOBIN A1C
Hgb A1c MFr Bld: 7.2 % — ABNORMAL HIGH (ref 4.8–5.6)
Mean Plasma Glucose: 159.94 mg/dL

## 2022-01-04 MED ORDER — AMLODIPINE BESYLATE 10 MG PO TABS: 10.0000 mg | ORAL_TABLET | Freq: Every day | ORAL | 1 refills | Status: DC

## 2022-01-04 MED ORDER — METFORMIN HCL ER 500 MG PO TB24: ORAL_TABLET | ORAL | 1 refills | Status: AC

## 2022-01-04 MED ORDER — VALSARTAN-HYDROCHLOROTHIAZIDE 160-25 MG PO TABS: 1.0000 | ORAL_TABLET | Freq: Every day | ORAL | 1 refills | Status: DC

## 2022-01-04 NOTE — ED Triage Notes (Signed)
Pt reports his MD left the practice and he can not get in see another DR. Pt has run out of medications for BP and DM. ?

## 2022-01-04 NOTE — ED Provider Notes (Signed)
?[facility] ? ? ? ?CSN: [id number] ?Arrival date & time: 01/04/22  1149 ? ? ?  ? ?History   ?Chief Complaint ?Chief Complaint  ?Patient presents with  ? Hypertension  ? Diabetes  ? ? ?HPI ?Donald Cole is a 61 y.o. male.  ? ?Patient presents today requesting refill of chronic medications.  He has a history of hypertension and is not currently taking any medication.  Reports he has not taken his medication approximately 1 month ago.  He reports that his primary care provider left the practice and he has not been able to establish with someone since then.  He denies any chest pain, shortness of breath, headache, vision change.  He is physically active as he has a history of arthritis in his knees and will require knee replacement in the future. ? ?In addition, patient has a history of diabetes.  He was previously prescribed metformin but has not been taking this medication recently.  He is not monitoring his blood sugar at home.  He does report some numbness and tingling in his hands.  He was concerned this was a side effect of metformin but does not take this as regularly.  His last A1c was 7.0% 11/23/2020.  He does report polydipsia and polyuria but denies symptomatic hyper or hypoglycemia. ? ? ?Past Medical History:  ?Diagnosis Date  ? Arthritis   ? Diabetes mellitus without complication (HCC)   ? GERD (gastroesophageal reflux disease)   ? History of kidney stones   ? Hypercholesterolemia   ? Hypertension   ? non compliant with amlodipine  ? Pre-diabetes   ? Rupture of UCL of right thumb   ? ? ?Patient Active Problem List  ? Diagnosis Date Noted  ? Primary osteoarthritis of left knee 01/10/2021  ? Osteochondral defect of femoral condyle 06/15/2020  ? Grade I diastolic dysfunction 07/16/2019  ? Hyperlipidemia associated with type 2 diabetes mellitus (HCC) 07/10/2019  ? Diabetes mellitus without complication (HCC) 07/08/2019  ? Benign essential HTN 07/08/2019  ? Gamekeeper's thumb of right hand  11/11/2018  ? ? ?Past Surgical History:  ?Procedure Laterality Date  ? LIGAMENT REPAIR Right 11/14/2018  ? Procedure: RIGHT THUMB ULNAR COLLATERAL LIGAMENT REPAIR;  Surgeon: Dairl Ponder, MD;  Location: [facility];  Service: Orthopedics;  Laterality: Right;  ? TOTAL KNEE ARTHROPLASTY Left 01/10/2021  ? Procedure: LEFT TOTAL KNEE ARTHROPLASTY;  Surgeon: Marcene Corning, MD;  Location: [facility];  Service: Orthopedics;  Laterality: Left;  ? WISDOM TOOTH EXTRACTION    ? ? ? ? ? ?Home Medications   ? ?Prior to Admission medications   ?Medication Sig Start Date End Date Taking? Authorizing Provider  ?metFORMIN (GLUCOPHAGE-XR) 500 MG 24 hr tablet Take 500 mg tablet daily week 1, take 500 mg tablet twice daily week 2, take two 500 mg tablet (1000 g) morning and one 500 mg tablet at night week 3, take 1000 mg (2 tablets) twice daily thereafter. 01/04/22  Yes Ellyce Lafevers K, PA-C  ?rosuvastatin (CRESTOR) 10 MG tablet TAKE ONE TABLET AT NIGHT FOR CHOLESTEROL 12/25/21   Ardith Dark, MD  ?amLODipine (NORVASC) 10 MG tablet Take 1 tablet (10 mg total) by mouth daily. 01/04/22   Alik Mawson, Noberto Retort, PA-C  ?Menthol-Camphor (TIGER BALM ARTHRITIS RUB EX) Apply 1 application topically daily as needed (pain).    [provider]  ?Multiple Vitamins-Minerals (ONE-A-DAY ENERGY) TABS Take 1 tablet by mouth daily.    [provider]  ?OVER THE COUNTER MEDICATION  Take 1 Scoop by mouth daily. Super beets    [provider]  ?tiZANidine (ZANAFLEX) 4 MG tablet Take 1 tablet (4 mg total) by mouth every 6 (six) hours as needed for muscle spasms. 01/10/21 01/10/22  Elodia Florence, PA-C  ?valsartan-hydrochlorothiazide (DIOVAN-HCT) 160-25 MG tablet Take 1 tablet by mouth daily. 01/04/22   Dorris Pierre, Noberto Retort, PA-C  ? ? ?Family History ?Family History  ?Problem Relation Age of Onset  ? Cancer Mother   ? ? ?Social History ?Social History  ? ?Tobacco Use  ? Smoking status: Never  ? Smokeless tobacco: Never  ?Vaping Use  ?  Vaping Use: Never used  ?Substance Use Topics  ? Alcohol use: No  ? Drug use: No  ? ? ? ?Allergies   ?Tramadol ? ? ?Review of Systems ?Review of Systems  ?Constitutional:  Positive for activity change and fatigue. Negative for appetite change and fever.  ?Eyes:  Negative for visual disturbance.  ?Respiratory:  Negative for cough and shortness of breath.   ?Gastrointestinal:  Negative for abdominal pain, diarrhea, nausea and vomiting.  ?Endocrine: Positive for polydipsia and polyuria. Negative for polyphagia.  ?Neurological:  Positive for numbness (bilateral hands). Negative for dizziness, weakness, light-headedness and headaches.  ? ? ?Physical Exam ?Triage Vital Signs ?ED Triage Vitals  ?Enc Vitals Group  ?   BP 01/04/22 1302 (!) 181/100  ?   Pulse Rate 01/04/22 1256 64  ?   Resp 01/04/22 1256 18  ?   Temp 01/04/22 1256 97.7 ?F (36.5 ?C)  ?   Temp src --   ?   SpO2 01/04/22 1256 98 %  ?   Weight --   ?   Height --   ?   Head Circumference --   ?   Peak Flow --   ?   Pain Score 01/04/22 1253 0  ?   Pain Loc --   ?   Pain Edu? --   ?   Excl. in GC? --   ? ?No data found. ? ?Updated Vital Signs ?BP (!) 198/107   Pulse (!) 57   Temp 97.7 ?F (36.5 ?C)   Resp 18   SpO2 97%  ? ?Visual Acuity ?Right Eye Distance:   ?Left Eye Distance:   ?Bilateral Distance:   ? ?Right Eye Near:   ?Left Eye Near:    ?Bilateral Near:    ? ?Physical Exam ?Constitutional:   ?   General: He is awake.  ?   Appearance: Normal appearance. He is well-developed. He is not ill-appearing.  ?   Comments: Very pleasant male appears stated age in no acute distress sitting comfortably in exam room  ?HENT:  ?   Head: Normocephalic and atraumatic.  ?   Mouth/Throat:  ?   Pharynx: No oropharyngeal exudate, posterior oropharyngeal erythema or uvula swelling.  ?Cardiovascular:  ?   Rate and Rhythm: Normal rate and regular rhythm.  ?   Heart sounds: Normal heart sounds, S1 normal and S2 normal. No murmur heard. ?Pulmonary:  ?   Effort: Pulmonary effort is  normal.  ?   Breath sounds: Normal breath sounds. No stridor. No wheezing, rhonchi or rales.  ?   Comments: Clear to auscultation bilaterally ?Musculoskeletal:  ?   Right hand: Swelling present. No deformity. Normal range of motion. Normal capillary refill.  ?   Left hand: Swelling present. No deformity. Normal range of motion. Normal capillary refill.  ?   Right lower leg: No edema.  ?  Left lower leg: No edema.  ?   Comments: Swelling noted bilateral hands.  Normal two-point discrimination.  Hands neurovascularly intact.  ?Neurological:  ?   Mental Status: He is alert.  ?Psychiatric:     ?   Behavior: Behavior is cooperative.  ? ? ? ?UC Treatments / Results  ?Labs ?(all labs ordered are listed, but only abnormal results are displayed) ?Labs Reviewed  ?CBG MONITORING, ED - Abnormal; Notable for the following components:  ?    Result Value  ? Glucose-Capillary 234 (*)   ? All other components within normal limits  ?POCT URINALYSIS DIPSTICK, ED / UC - Abnormal; Notable for the following components:  ? Glucose, UA 500 (*)   ? Ketones, ur TRACE (*)   ? All other components within normal limits  ?CBC  ?COMPREHENSIVE METABOLIC PANEL  ?HEMOGLOBIN A1C  ?VITAMIN B12  ? ? ?EKG ? ? ?Radiology ?No results found. ? ?Procedures ?Procedures (including critical care time) ? ?Medications Ordered in UC ?Medications - No data to display ? ?Initial Impression / Assessment and Plan / UC Course  ?I have reviewed the triage vital signs and the nursing notes. ? ?Pertinent labs & imaging results that were available during my care of the patient were reviewed by me and considered in my medical decision making (see chart for details). ? ?  ? ?Blood pressure is very elevated today.  Patient denies any signs/symptoms of endorgan damage.  We will restart blood pressure medications as previously prescribed.  Refill sent to pharmacy.  CBC and CMP obtained today-results pending.  Discussed diet and lifestyle modification for additional symptom  relief.  Discussed the importance of following up with primary care and we will try to establish him with someone via PCP assistance.  Discussed that some needs to recheck his blood pressure within a week and if he cann

## 2022-01-04 NOTE — Discharge Instructions (Addendum)
I will contact you for lab work is abnormal we will need to change our treatment plan.  Please restart your medication as previously prescribed.  As we discussed, I recommend titrating up metformin dose until you are at 2000 mg daily (1000 mg twice daily).  Monitor your diet for salt as well as carbohydrates.  Avoid decongestants, NSAIDs, caffeine, sodium.  We will try to establish you with a primary care provider but someone should see you for your blood pressure recheck within a week.  If you are unable to see them please return here for follow-up.  If you develop any chest pain, shortness of breath, headache, dizziness you need to go to the emergency room immediately. ?

## 2022-01-17 ENCOUNTER — Emergency Department (HOSPITAL_COMMUNITY): Payer: 59

## 2022-01-17 ENCOUNTER — Emergency Department (HOSPITAL_COMMUNITY)
Admission: EM | Admit: 2022-01-17 | Discharge: 2022-01-18 | Disposition: A | Discharge status: Home / Self Care | Payer: 59 | Attending: Emergency Medicine | Admitting: Emergency Medicine

## 2022-01-17 ENCOUNTER — Encounter (HOSPITAL_COMMUNITY): Payer: Self-pay

## 2022-01-17 ENCOUNTER — Other Ambulatory Visit: Payer: Self-pay

## 2022-01-17 DIAGNOSIS — N50811 Right testicular pain: Secondary | ICD-10-CM | POA: Diagnosis not present

## 2022-01-17 DIAGNOSIS — Z79899 Other long term (current) drug therapy: Secondary | ICD-10-CM | POA: Insufficient documentation

## 2022-01-17 DIAGNOSIS — I10 Essential (primary) hypertension: Secondary | ICD-10-CM | POA: Insufficient documentation

## 2022-01-17 DIAGNOSIS — N433 Hydrocele, unspecified: Secondary | ICD-10-CM | POA: Diagnosis not present

## 2022-01-17 DIAGNOSIS — E119 Type 2 diabetes mellitus without complications: Secondary | ICD-10-CM | POA: Insufficient documentation

## 2022-01-17 DIAGNOSIS — Z7984 Long term (current) use of oral hypoglycemic drugs: Secondary | ICD-10-CM | POA: Insufficient documentation

## 2022-01-17 LAB — URINALYSIS, ROUTINE W REFLEX MICROSCOPIC
Bacteria, UA: NONE SEEN
Bilirubin Urine: NEGATIVE
Glucose, UA: >=500 mg/dL — AB
Hgb urine dipstick: NEGATIVE
Ketones, ur: NEGATIVE mg/dL
Leukocytes,Ua: NEGATIVE
Nitrite: NEGATIVE
Protein, ur: NEGATIVE mg/dL
Specific Gravity, Urine: 1.02 (ref 1.005–1.030)
pH: 7 (ref 5.0–8.0)

## 2022-01-17 NOTE — ED Triage Notes (Signed)
Pt states that his right testicle is hurting and he has a knot in it since yesterday.  ?

## 2022-01-17 NOTE — ED Provider Notes (Signed)
?[facility]-EMERGENCY DEPT ?Provider Note ? ? ?CSN: [id number] ?Arrival date & time: 01/17/22  2226 ? ?  ? ?History ? ?Chief Complaint  ?Patient presents with  ? Testicle Pain  ? ? ?Donald Cole is a 61 y.o. male. ? ?HPI ?Patient is a 61 year old male with a history of hypertension, diabetes mellitus, who presents to the emergency department due to right testicle pain.  States it started yesterday and has been worsening.  Reports a palpable nodule on the right testicle as well that he believes is the source of his pain.  Denies any dysuria, hematuria, penile discharge, penile pain, rashes/lesions.  States that he recently wore unsupportive boxers which is not typical for him and his pain started shortly thereafter. ?  ? ?Home Medications ?Prior to Admission medications   ?Medication Sig Start Date End Date Taking? Authorizing Provider  ?rosuvastatin (CRESTOR) 10 MG tablet TAKE ONE TABLET AT NIGHT FOR CHOLESTEROL 12/25/21   Ardith DarkParker, Caleb M, MD  ?amLODipine (NORVASC) 10 MG tablet Take 1 tablet (10 mg total) by mouth daily. 01/04/22   Raspet, Noberto RetortErin K, PA-C  ?Menthol-Camphor (TIGER BALM ARTHRITIS RUB EX) Apply 1 application topically daily as needed (pain).    [provider]  ?metFORMIN (GLUCOPHAGE-XR) 500 MG 24 hr tablet Take 500 mg tablet daily week 1, take 500 mg tablet twice daily week 2, take two 500 mg tablet (1000 g) morning and one 500 mg tablet at night week 3, take 1000 mg (2 tablets) twice daily thereafter. 01/04/22   Raspet, Noberto RetortErin K, PA-C  ?Multiple Vitamins-Minerals (ONE-A-DAY ENERGY) TABS Take 1 tablet by mouth daily.    [provider]  ?OVER THE COUNTER MEDICATION Take 1 Scoop by mouth daily. Super beets    [provider]  ?valsartan-hydrochlorothiazide (DIOVAN-HCT) 160-25 MG tablet Take 1 tablet by mouth daily. 01/04/22   Raspet, Noberto RetortErin K, PA-C  ?   ? ?Allergies    ?Tramadol   ? ?Review of Systems   ?Review of Systems  ?All other systems reviewed and are  negative. ?Ten systems reviewed and are negative for acute change, except as noted in the HPI.   ?Physical Exam ?Updated Vital Signs ?BP (!) 176/84 (BP Location: Right Arm)   Pulse 68   Temp 98.2 ?F (36.8 ?C) (Oral)   Resp 18   Ht 5\' 8"  (1.727 m)   Wt 106.6 kg   SpO2 99%   BMI 35.73 kg/m?  ?Physical Exam ?Vitals and nursing note reviewed.  ?Constitutional:   ?   General: He is not in acute distress. ?   Appearance: Normal appearance. He is not ill-appearing, toxic-appearing or diaphoretic.  ?HENT:  ?   Head: Normocephalic and atraumatic.  ?   Right Ear: External ear normal.  ?   Left Ear: External ear normal.  ?   Nose: Nose normal.  ?   Mouth/Throat:  ?   Mouth: Mucous membranes are moist.  ?   Pharynx: Oropharynx is clear. No oropharyngeal exudate or posterior oropharyngeal erythema.  ?Eyes:  ?   Extraocular Movements: Extraocular movements intact.  ?Cardiovascular:  ?   Rate and Rhythm: Normal rate.  ?   Pulses: Normal pulses.  ?Pulmonary:  ?   Effort: Pulmonary effort is normal.  ?Abdominal:  ?   General: Abdomen is flat. There is no distension.  ?Genitourinary: ?   Comments: Nursing chaperone present.  Normal-appearing circumcised penis.  Mild tenderness noted along the right testicle.  No palpable nodules noted.  No  rashes or lesions.  No other regions of tenderness appreciated.  No discharge noted at the urethral meatus. ?Musculoskeletal:     ?   General: Normal range of motion.  ?   Cervical back: Normal range of motion and neck supple. No tenderness.  ?Skin: ?   General: Skin is warm and dry.  ?Neurological:  ?   General: No focal deficit present.  ?   Mental Status: He is alert and oriented to person, place, and time.  ?Psychiatric:     ?   Mood and Affect: Mood normal.     ?   Behavior: Behavior normal.  ? ?ED Results / Procedures / Treatments   ?Labs ?(all labs ordered are listed, but only abnormal results are displayed) ?Labs Reviewed  ?URINALYSIS, ROUTINE W REFLEX MICROSCOPIC - Abnormal; Notable  for the following components:  ?    Result Value  ? Glucose, UA >=500 (*)   ? All other components within normal limits  ? ? ?EKG ?None ? ?Radiology ?US SCROTUM W/DOPPLER ? ?Result Date: 01/17/2022 ?CLINICAL DATA:  Right testicle pain EXAM: SCROTAL ULTRASOUND DOPPLER ULTRASOUND OF THE TESTICLES TECHNIQUE: Complete ultrasound examination of the testicles, epididymis, and other scrotal structures was performed. Color and spectral Doppler ultrasound were also utilized to evaluate blood flow to the testicles. COMPARISON:  None Available. FINDINGS: Right testicle Measurements: 4.5 x 2.8 x 2.9 cm. No mass or microlithiasis visualized. Left testicle Measurements: 4.2 x 2.3 x 2.9 cm. No mass or microlithiasis visualized. Right epididymis:  Normal in size and appearance. Left epididymis:  Normal in size and appearance. Hydrocele:  Tiny bilateral hydroceles Varicocele:  None visualized. Pulsed Doppler interrogation of both testes demonstrates normal low resistance arterial and venous waveforms bilaterally. IMPRESSION: 1. Negative for torsion or mass. 2. Trace bilateral hydroceles Electronically Signed   By: Jasmine Pang M.D.   On: 01/17/2022 23:44   ? ?Procedures ?Procedures  ? ?Medications Ordered in ED ?Medications - No data to display ? ?ED Course/ Medical Decision Making/ A&P ?  ?                        ?Medical Decision Making ?Amount and/or Complexity of Data Reviewed ?Labs: ordered. ?Radiology: ordered. ? ?Pt is a 61 y.o. male who presents to the emergency department due to right testicle pain. ? ?Labs: ?UA with greater than or equal to 500 glucose. ? ?Imaging: ?Ultrasound of the scrotum with Doppler is negative for torsion or mass.  Trace bilateral hydroceles. ? ?I, Placido Sou, PA-C, personally reviewed and evaluated these images and lab results as part of my medical decision-making. ? ?Exam performed with nursing chaperone present.  Normal-appearing circumcised penis.  Mild tenderness appreciated along the right  testicle with no palpable masses noted.  No rashes or lesions.  No swelling noted.  No other regions of tenderness.  UA was obtained in triage which does show glucosuria but otherwise is reassuring and does not appear infectious.  I obtained a scrotal ultrasound which is negative for torsion or mass.  They do note trace bilateral hydroceles. ? ?Patient appears stable for discharge at this time and he is agreeable.  Recommended that he continue wearing supportive underwear.  Tylenol as well as Motrin as needed for management of his pain.  He was given a referral to urology if he finds that his symptoms are persistent.  He was given very strict return precautions and understands to return to the emergency department with any  new or worsening symptoms.  His questions were answered and he was amicable at the time of discharge. ? ?Note: Portions of this report may have been transcribed using voice recognition software. Every effort was made to ensure accuracy; however, inadvertent computerized transcription errors may be present.  ? ?Final Clinical Impression(s) / ED Diagnoses ?Final diagnoses:  ?Pain in right testicle  ? ?Rx / DC Orders ?ED Discharge Orders   ? ? None  ? ?  ? ? ?  ?Placido Sou, PA-C ?01/18/22 0019 ? ?  ?Geoffery Lyons, MD ?01/18/22 [phone] ? ?

## 2022-01-18 NOTE — Discharge Instructions (Signed)
I would recommend taking Tylenol as well as Motrin as needed for management of your testicular pain.  Please follow the instructions on the bottles.  Please continue to wear supportive underwear and see if this helps resolve your symptoms. ? ?Below is the contact information for [facility].  If you find you are having persistent discomfort in the right testicle please give them a call and schedule an appointment for reevaluation.  If you develop any new or worsening symptoms please come back to the emergency department. ?

## 2022-01-26 ENCOUNTER — Other Ambulatory Visit: Payer: Self-pay | Admitting: Family Medicine

## 2022-04-05 ENCOUNTER — Other Ambulatory Visit: Payer: Self-pay

## 2022-04-05 ENCOUNTER — Emergency Department (HOSPITAL_COMMUNITY)
Admission: EM | Admit: 2022-04-05 | Discharge: 2022-04-05 | Disposition: A | Discharge status: Home / Self Care | Payer: Commercial Managed Care - HMO | Attending: Emergency Medicine | Admitting: Emergency Medicine

## 2022-04-05 ENCOUNTER — Encounter (HOSPITAL_COMMUNITY): Payer: Self-pay

## 2022-04-05 DIAGNOSIS — E119 Type 2 diabetes mellitus without complications: Secondary | ICD-10-CM | POA: Diagnosis not present

## 2022-04-05 DIAGNOSIS — Z760 Encounter for issue of repeat prescription: Secondary | ICD-10-CM | POA: Diagnosis not present

## 2022-04-05 DIAGNOSIS — Z79899 Other long term (current) drug therapy: Secondary | ICD-10-CM | POA: Insufficient documentation

## 2022-04-05 DIAGNOSIS — J018 Other acute sinusitis: Secondary | ICD-10-CM | POA: Insufficient documentation

## 2022-04-05 DIAGNOSIS — I10 Essential (primary) hypertension: Secondary | ICD-10-CM | POA: Insufficient documentation

## 2022-04-05 DIAGNOSIS — Z7984 Long term (current) use of oral hypoglycemic drugs: Secondary | ICD-10-CM | POA: Insufficient documentation

## 2022-04-05 DIAGNOSIS — R0981 Nasal congestion: Secondary | ICD-10-CM | POA: Diagnosis present

## 2022-04-05 MED ORDER — AMLODIPINE BESYLATE 10 MG PO TABS: 10.0000 mg | ORAL_TABLET | Freq: Every day | ORAL | 1 refills | Status: AC

## 2022-04-05 MED ORDER — VALSARTAN-HYDROCHLOROTHIAZIDE 160-25 MG PO TABS: 1.0000 | ORAL_TABLET | Freq: Every day | ORAL | 1 refills | Status: AC

## 2022-04-05 MED ORDER — AMOXICILLIN-POT CLAVULANATE 875-125 MG PO TABS
1.0000 | ORAL_TABLET | Freq: Two times a day (BID) | ORAL | 0 refills | Status: AC
Start: 2022-04-05 — End: ?

## 2022-04-05 NOTE — ED Provider Notes (Signed)
[facility] Provider Note   CSN: [id number] Arrival date & time: 04/05/22  2124     History  Chief Complaint  Patient presents with   Medication Refill   Nasal Congestion    Donald Cole is a 61 y.o. male who presents emergency department with need for medication refill.  And has a history of hypertension and diabetes.  He is out of his antihypertensives has not taken them today.  Patient states that "life got away from him" and he has not yet found a primary care physician but is actively looking for 1.  He does not want to go any further without having his antihypertensives as he has fairly high blood pressure readings at home. He denies headache, visual disturbance, chest pain, shortness of breath or peripheral edema. Patient also complaining of 8 days of right-sided facial pain congestion and cough.  He is concerned that he has a sinus infection has a history of the same.  He has had discolored nasal discharge.  Denies severe headache, difficulty swallowing.   Medication Refill      Home Medications Prior to Admission medications   Medication Sig Start Date End Date Taking? Authorizing Provider  amoxicillin-clavulanate (AUGMENTIN) 875-125 MG tablet Take 1 tablet by mouth every 12 (twelve) hours. 04/05/22  Yes Lazar Tierce, PA-C  rosuvastatin (CRESTOR) 10 MG tablet TAKE ONE TABLET AT NIGHT FOR CHOLESTEROL 12/25/21   Ardith Dark, MD  amLODipine (NORVASC) 10 MG tablet Take 1 tablet (10 mg total) by mouth daily. 04/05/22   Dorien Bessent, Cammy Copa, PA-C  Menthol-Camphor (TIGER BALM ARTHRITIS RUB EX) Apply 1 application topically daily as needed (pain).    [provider]  metFORMIN (GLUCOPHAGE-XR) 500 MG 24 hr tablet Take 500 mg tablet daily week 1, take 500 mg tablet twice daily week 2, take two 500 mg tablet (1000 g) morning and one 500 mg tablet at night week 3, take 1000 mg (2 tablets) twice daily thereafter. 01/04/22   Raspet, Noberto Retort,  PA-C  Multiple Vitamins-Minerals (ONE-A-DAY ENERGY) TABS Take 1 tablet by mouth daily.    [provider]  OVER THE COUNTER MEDICATION Take 1 Scoop by mouth daily. Super beets    [provider]  valsartan-hydrochlorothiazide (DIOVAN-HCT) 160-25 MG tablet Take 1 tablet by mouth daily. 04/05/22   Arthor Captain, PA-C      Allergies    Tramadol    Review of Systems   Review of Systems  Physical Exam Updated Vital Signs BP (!) 169/99 (BP Location: Left Arm)   Pulse 65   Temp 98.2 F (36.8 C) (Oral)   Resp 16   Ht 5\' 8"  (1.727 m)   Wt 108.9 kg   SpO2 100%   BMI 36.49 kg/m  Physical Exam Vitals and nursing note reviewed.  Constitutional:      General: He is not in acute distress.    Appearance: He is well-developed. He is not diaphoretic.  HENT:     Head: Normocephalic and atraumatic.     Comments: Tenderness to palpation of the sinuses on the right side of the face Swelling of the turbinates bilaterally in the nasal passages    Nose: Congestion present.  Eyes:     General: No scleral icterus.    Conjunctiva/sclera: Conjunctivae normal.  Cardiovascular:     Rate and Rhythm: Normal rate and regular rhythm.     Heart sounds: Normal heart sounds.  Pulmonary:     Effort: Pulmonary effort is normal. No  respiratory distress.     Breath sounds: Normal breath sounds.  Abdominal:     Palpations: Abdomen is soft.     Tenderness: There is no abdominal tenderness.  Musculoskeletal:     Cervical back: Normal range of motion and neck supple.  Skin:    General: Skin is warm and dry.  Neurological:     Mental Status: He is alert.  Psychiatric:        Behavior: Behavior normal.     ED Results / Procedures / Treatments   Labs (all labs ordered are listed, but only abnormal results are displayed) Labs Reviewed - No data to display  EKG None  Radiology No results found.  Procedures Procedures    Medications Ordered in ED Medications - No data to  display  ED Course/ Medical Decision Making/ A&P                           Medical Decision Making Patient here with medication refill request.  I have ordered and reviewed filled his antihypertensive medications which she is out of.  He is elevated in blood pressure today. Patient also appears to have acute sinusitis.  I have ordered Augmentin for treatment.  No signs of severe infection, meningitis, mucormycosis or other emergent cause of symptoms.  Discussed outpatient follow-up, return precautions.  Patient directed to the 800-number in the AVS which can help set him up with primary care.  Patient appears otherwise appropriate for discharge at this time   Risk Prescription drug management.           Final Clinical Impression(s) / ED Diagnoses Final diagnoses:  Medication refill  Acute non-recurrent sinusitis of other sinus    Rx / DC Orders ED Discharge Orders          Ordered    amLODipine (NORVASC) 10 MG tablet  Daily        04/05/22 2152    valsartan-hydrochlorothiazide (DIOVAN-HCT) 160-25 MG tablet  Daily        04/05/22 2152    amoxicillin-clavulanate (AUGMENTIN) 875-125 MG tablet  Every 12 hours        04/05/22 2152              Arthor Captain, PA-C 04/06/22 Silva Bandy    Pricilla Loveless, MD 04/07/22 [phone]

## 2022-04-05 NOTE — ED Triage Notes (Signed)
Patient said he ran out of his blood pressure medication "for a couple days now." Needs a refill. Also having sinus pressure/congestion.

## 2022-04-05 NOTE — Discharge Instructions (Addendum)
Please try to establish care with a primary care physician as soon as possible to manage your ongoing medical issues. I would recommend [facility] which is where I sent my parents for care.  You may call to see if they have any openings for new patients.

## 2022-04-26 IMAGING — MR MR KNEE*L* W/O CM
4 of 6 series · 20 of 40 positions shown · non-contrast
Comparison: Radiographs 02/25/2020

CLINICAL DATA: Chronic left knee pain.

EXAM:
MRI OF THE LEFT KNEE WITHOUT CONTRAST
TECHNIQUE: Multiplanar, multisequence MR imaging of the knee was performed. No
intravenous contrast was administered.

[Series 3: T2 fat-sat · axial · 4.0mm · 0.50mm/px · z∈[-63,+32]mm · 3 of 24 slices shown (1 of 2)]
[im 5/24]
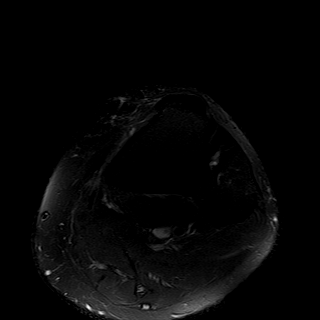
[im 14/24]
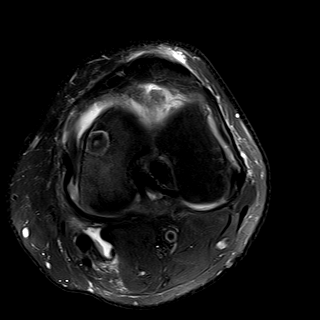
[im 24/24]
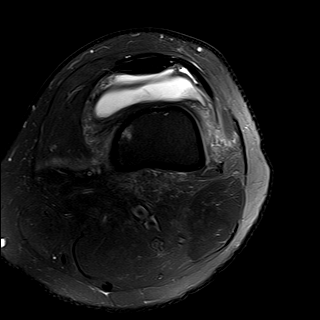

[Series 5: T2 fat-sat · coronal · 4.0mm · 0.29mm/px · 3 of 26 slices shown (2 of 2)]
[im 6/26]
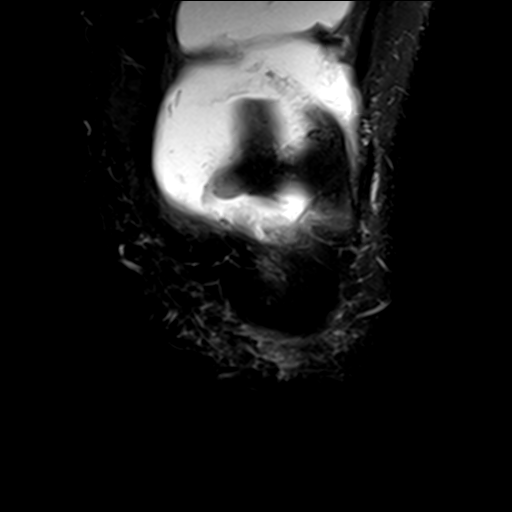
[im 16/26]
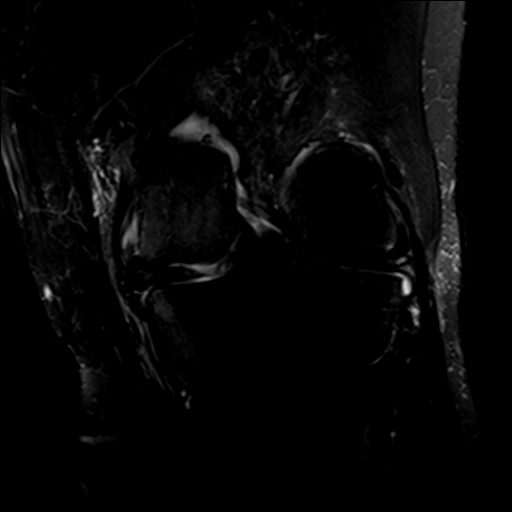
[im 26/26]
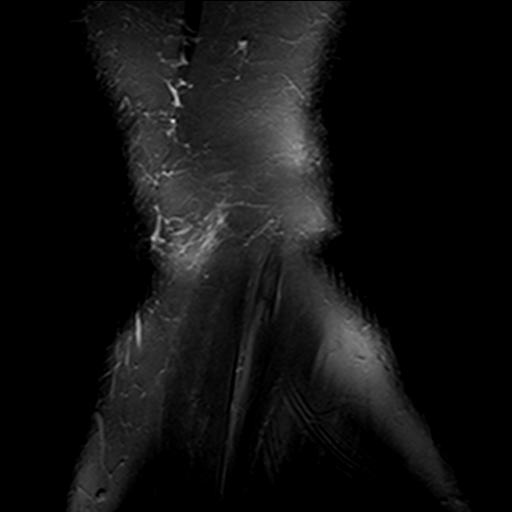

[Series 6: PD fat-sat · coronal · 3.0mm · 0.29mm/px · 8 of 32 slices shown (1 of 2)]
[im 1/32]
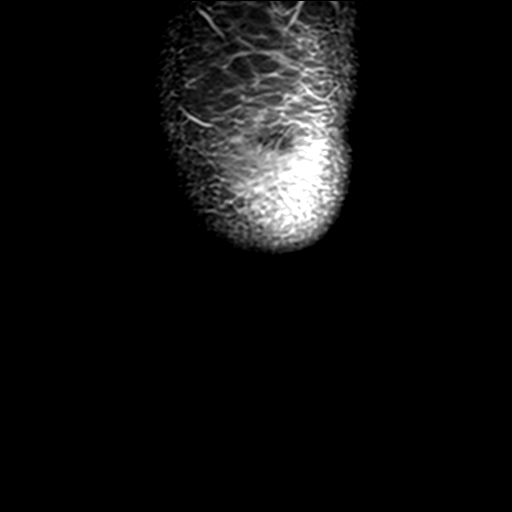
[im 5/32]
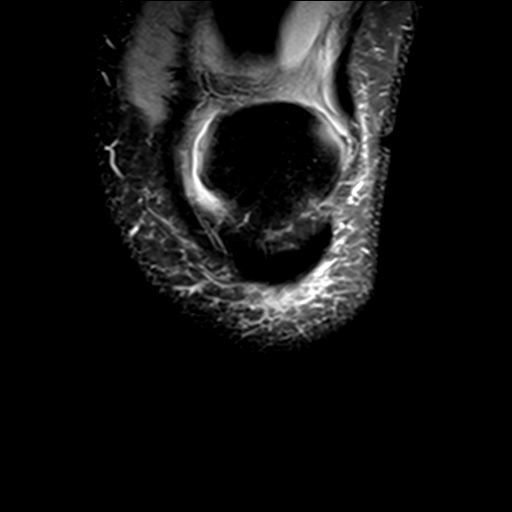
[im 9/32]
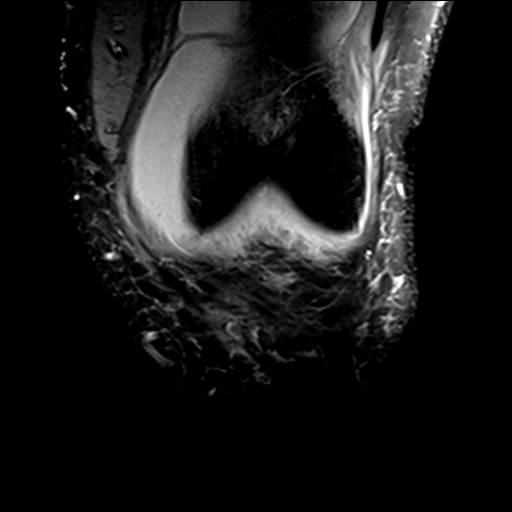
[im 14/32]
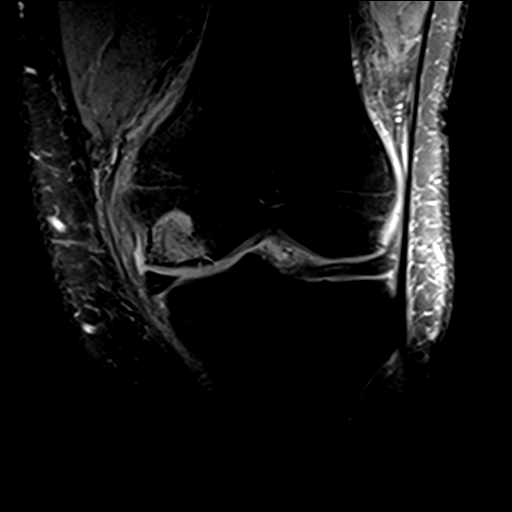
[im 18/32]
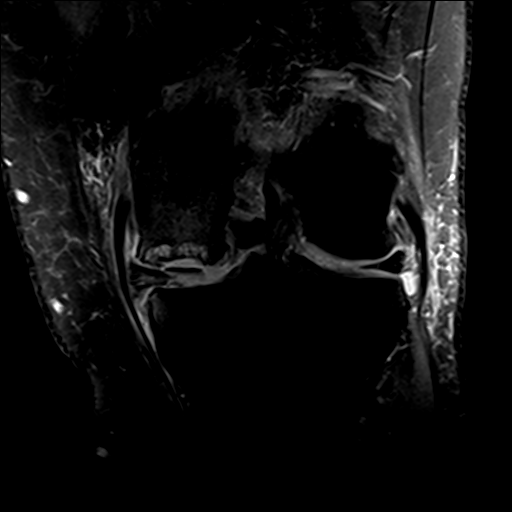
[im 23/32]
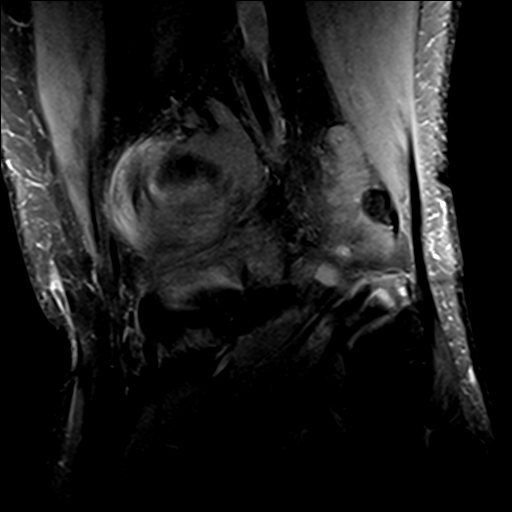
[im 27/32]
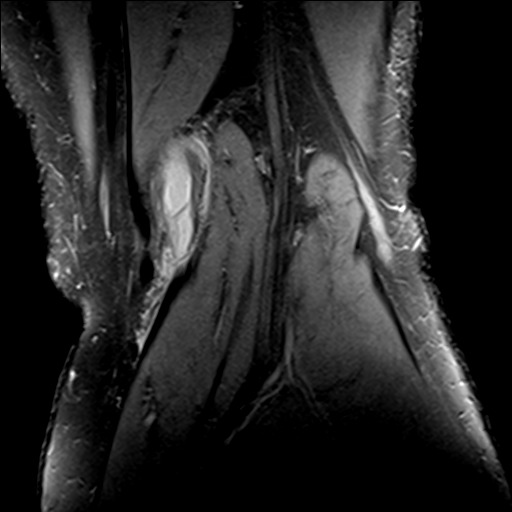
[im 32/32]
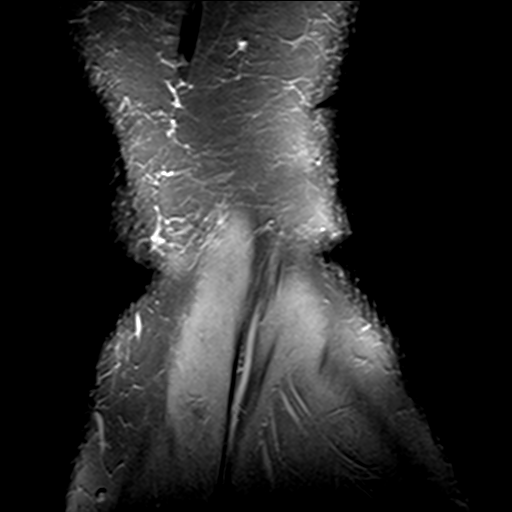

[Series 8: PD fat-sat · sagittal · 3.0mm · 0.29mm/px · 6 of 28 slices shown (2 of 2)]
[im 1/28]
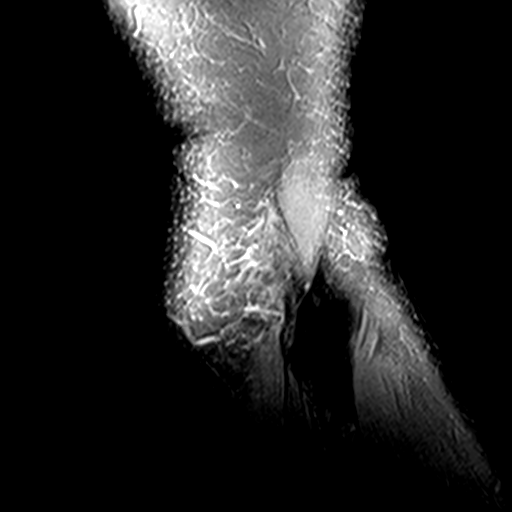
[im 5/28]
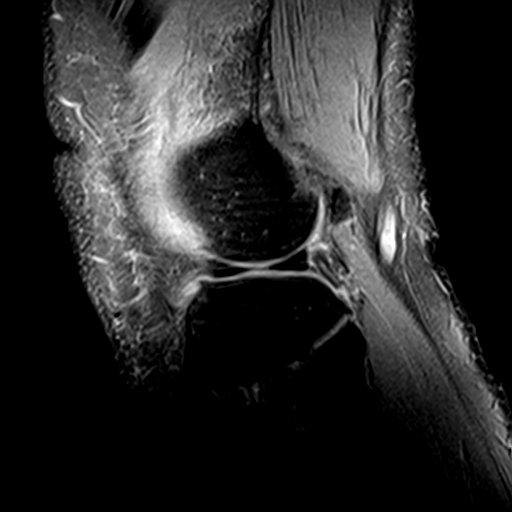
[im 10/28]
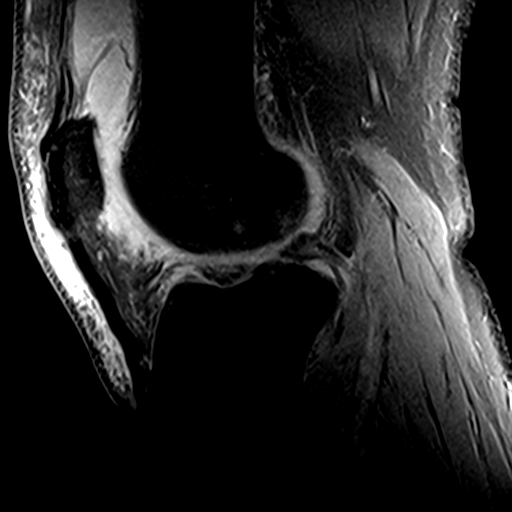
[im 14/28]
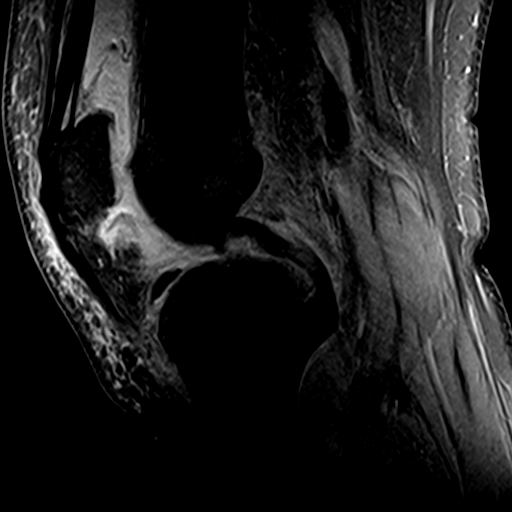
[im 19/28]
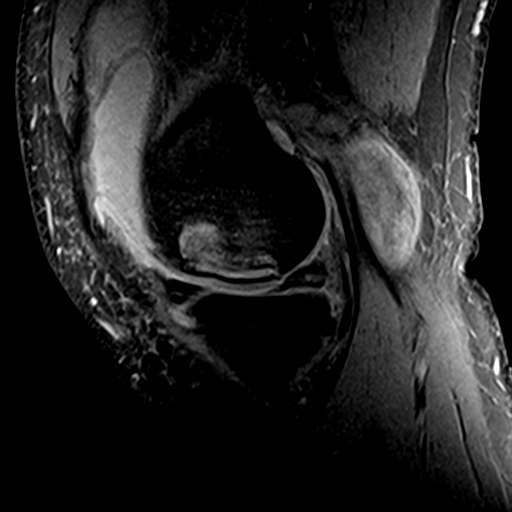
[im 23/28]
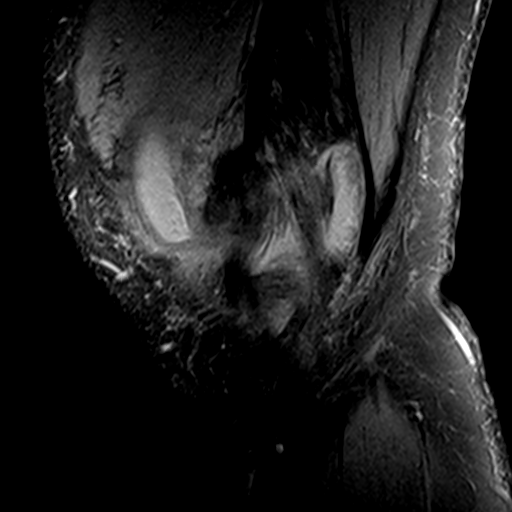

[20 of 40 positions shown; findings below may reference images not displayed]

FINDINGS: MENISCI

Medial meniscus: Partial thickness radial tear involving the
posterior horn with medial protrusion of the meniscus. Advanced
intrameniscal degenerative changes.

Lateral meniscus:  Small free edge tears in the midbody region.

LIGAMENTS

Cruciates:  Intact

Collaterals:  Intact

CARTILAGE

Patellofemoral:  Moderate degenerative chondrosis.

Medial: Advanced degenerative chondrosis. There is a large chronic
osteochondral defect involving the medial femoral condyle with a
large adjacent loose or potentially loose osteochondral fragment
measuring approximately the 2.3 x 9.5 mm. Extensive surrounding
subchondral cystic change in the medial femoral condyle and moderate
surrounding marrow edema.

Lateral:  Moderate degenerative chondrosis.

Joint: Large joint effusion and moderate synovitis. Superior and
medial patellar plica are noted.

Popliteal Fossa:  Small Baker's cyst.

Extensor Mechanism: The patella retinacular structures are intact
and the quadriceps and patellar tendons are intact.

Bones:  No acute bony findings.

Other: Unremarkable knee musculature.
IMPRESSION: 1. Partial thickness radial tear involving the posterior horn of the
medial meniscus with medial protrusion of the meniscus.
2. Small free edge tears in the midbody region of the lateral
meniscus.
3. Intact ligamentous structures and no acute bony findings.
4. Tricompartmental degenerative changes most significant in the
medial compartment.
5. Large chronic osteochondral defect involving the medial femoral
condyle with an adjacent loose or partially loose osteochondral
fragment. Significant subchondral cystic changes and marrow edema.
6. Large joint effusion and moderate synovitis. Small Baker's cyst.

## 2022-05-07 ENCOUNTER — Emergency Department (HOSPITAL_COMMUNITY)
Admission: EM | Admit: 2022-05-07 | Discharge: 2022-05-07 | Discharge status: Against Medical Advice | Payer: Commercial Managed Care - HMO

## 2022-05-08 ENCOUNTER — Other Ambulatory Visit: Payer: Self-pay

## 2022-05-08 ENCOUNTER — Emergency Department (HOSPITAL_COMMUNITY): Payer: Commercial Managed Care - HMO

## 2022-05-08 ENCOUNTER — Emergency Department (HOSPITAL_COMMUNITY)
Admission: EM | Admit: 2022-05-08 | Discharge: 2022-05-08 | Disposition: A | Discharge status: Home / Self Care | Payer: Commercial Managed Care - HMO | Attending: Emergency Medicine | Admitting: Emergency Medicine

## 2022-05-08 DIAGNOSIS — Z79899 Other long term (current) drug therapy: Secondary | ICD-10-CM | POA: Diagnosis not present

## 2022-05-08 DIAGNOSIS — N200 Calculus of kidney: Secondary | ICD-10-CM | POA: Insufficient documentation

## 2022-05-08 DIAGNOSIS — Z7984 Long term (current) use of oral hypoglycemic drugs: Secondary | ICD-10-CM | POA: Insufficient documentation

## 2022-05-08 DIAGNOSIS — E119 Type 2 diabetes mellitus without complications: Secondary | ICD-10-CM | POA: Diagnosis not present

## 2022-05-08 DIAGNOSIS — I10 Essential (primary) hypertension: Secondary | ICD-10-CM | POA: Insufficient documentation

## 2022-05-08 DIAGNOSIS — R109 Unspecified abdominal pain: Secondary | ICD-10-CM

## 2022-05-08 LAB — CBC WITH DIFFERENTIAL/PLATELET
Abs Immature Granulocytes: 0.06 10*3/uL (ref 0.00–0.07)
Basophils Absolute: 0.1 10*3/uL (ref 0.0–0.1)
Basophils Relative: 1 %
Eosinophils Absolute: 0.1 10*3/uL (ref 0.0–0.5)
Eosinophils Relative: 1 %
HCT: 40.5 % (ref 39.0–52.0)
Hemoglobin: 13.6 g/dL (ref 13.0–17.0)
Immature Granulocytes: 1 %
Lymphocytes Relative: 13 %
Lymphs Abs: 1.6 10*3/uL (ref 0.7–4.0)
MCH: 29.4 pg (ref 26.0–34.0)
MCHC: 33.6 g/dL (ref 30.0–36.0)
MCV: 87.7 fL (ref 80.0–100.0)
Monocytes Absolute: 1.1 10*3/uL — ABNORMAL HIGH (ref 0.1–1.0)
Monocytes Relative: 9 %
Neutro Abs: 9.7 10*3/uL — ABNORMAL HIGH (ref 1.7–7.7)
Neutrophils Relative %: 75 %
Platelets: 237 10*3/uL (ref 150–400)
RBC: 4.62 MIL/uL (ref 4.22–5.81)
RDW: 12.8 % (ref 11.5–15.5)
WBC: 12.6 10*3/uL — ABNORMAL HIGH (ref 4.0–10.5)
nRBC: 0 % (ref 0.0–0.2)

## 2022-05-08 LAB — COMPREHENSIVE METABOLIC PANEL
ALT: 38 U/L (ref 0–44)
AST: 23 U/L (ref 15–41)
Albumin: 3.8 g/dL (ref 3.5–5.0)
Alkaline Phosphatase: 69 U/L (ref 38–126)
Anion gap: 8 (ref 5–15)
BUN: 14 mg/dL (ref 6–20)
CO2: 25 mmol/L (ref 22–32)
Calcium: 9 mg/dL (ref 8.9–10.3)
Chloride: 101 mmol/L (ref 98–111)
Creatinine, Ser: 1.52 mg/dL — ABNORMAL HIGH (ref 0.61–1.24)
GFR, Estimated: 52 mL/min — ABNORMAL LOW (ref >60)
Glucose, Bld: 201 mg/dL — ABNORMAL HIGH (ref 70–99)
Potassium: 4.1 mmol/L (ref 3.5–5.1)
Sodium: 134 mmol/L — ABNORMAL LOW (ref 135–145)
Total Bilirubin: 0.8 mg/dL (ref 0.3–1.2)
Total Protein: 7.2 g/dL (ref 6.5–8.1)

## 2022-05-08 LAB — URINALYSIS, ROUTINE W REFLEX MICROSCOPIC
Bilirubin Urine: NEGATIVE
Glucose, UA: >=500 mg/dL — AB
Hgb urine dipstick: NEGATIVE
Ketones, ur: NEGATIVE mg/dL
Leukocytes,Ua: NEGATIVE
Nitrite: NEGATIVE
Protein, ur: NEGATIVE mg/dL
Specific Gravity, Urine: 1.024 (ref 1.005–1.030)
pH: 5 (ref 5.0–8.0)

## 2022-05-08 MED ORDER — OXYCODONE HCL 5 MG PO TABS: 5.0000 mg | ORAL_TABLET | Freq: Four times a day (QID) | ORAL | 0 refills | Status: AC | PRN

## 2022-05-08 MED ORDER — TAMSULOSIN HCL 0.4 MG PO CAPS: 0.4000 mg | ORAL_CAPSULE | Freq: Every day | ORAL | 0 refills | Status: AC

## 2022-05-08 NOTE — ED Provider Notes (Signed)
I saw and evaluated the patient, reviewed the resident's note and I agree with the findings and plan.   This is a 61 year old male presents with right-sided flank pain.  Has evidence of formula kidney stone.  Pain is controlled this time.  Will be given referral to urology.   Lorre Nick, MD 05/08/22 1213

## 2022-05-08 NOTE — ED Provider Notes (Signed)
[facility] EMERGENCY DEPARTMENT Provider Note   CSN: [id number] Arrival date & time: 05/08/22  0865     History  Chief Complaint  Patient presents with   Abdominal Pain    Kidney stone   Flank Pain    Donald Cole is a 61 y.o. male with a history of T2DM, HLD, HTN, G1DD presenting for flank pain. Patient reports he has been having pain in his back and abdomen since Sunday and has been taking Aleve with only some mild improvement. Patient reports that he feels it is a kidney stone and this is the third one he has had. He has never had to have urologic intervention. He denies any urinary symptoms such as dysuria or frequency.   Abdominal Pain Associated symptoms: nausea   Associated symptoms: no chest pain, no chills, no cough, no diarrhea, no fatigue, no fever, no shortness of breath and no vomiting   Flank Pain Associated symptoms include abdominal pain. Pertinent negatives include no chest pain and no shortness of breath.       Home Medications Prior to Admission medications   Medication Sig Start Date End Date Taking? Authorizing Provider  oxyCODONE (ROXICODONE) 5 MG immediate release tablet Take 1 tablet (5 mg total) by mouth every 6 (six) hours as needed for up to 3 days for severe pain. 05/08/22 05/11/22 Yes Laurie Penado, DO  rosuvastatin (CRESTOR) 10 MG tablet TAKE ONE TABLET AT NIGHT FOR CHOLESTEROL 12/25/21   Ardith Dark, MD  tamsulosin (FLOMAX) 0.4 MG CAPS capsule Take 1 capsule (0.4 mg total) by mouth daily. 05/08/22  Yes Dionisio Aragones, DO  amLODipine (NORVASC) 10 MG tablet Take 1 tablet (10 mg total) by mouth daily. 04/05/22   Arthor Captain, PA-C  amoxicillin-clavulanate (AUGMENTIN) 875-125 MG tablet Take 1 tablet by mouth every 12 (twelve) hours. 04/05/22   Harris, Cammy Copa, PA-C  Menthol-Camphor (TIGER BALM ARTHRITIS RUB EX) Apply 1 application topically daily as needed (pain).    [provider]  metFORMIN (GLUCOPHAGE-XR) 500 MG  24 hr tablet Take 500 mg tablet daily week 1, take 500 mg tablet twice daily week 2, take two 500 mg tablet (1000 g) morning and one 500 mg tablet at night week 3, take 1000 mg (2 tablets) twice daily thereafter. 01/04/22   Raspet, Noberto Retort, PA-C  Multiple Vitamins-Minerals (ONE-A-DAY ENERGY) TABS Take 1 tablet by mouth daily.    [provider]  OVER THE COUNTER MEDICATION Take 1 Scoop by mouth daily. Super beets    [provider]  valsartan-hydrochlorothiazide (DIOVAN-HCT) 160-25 MG tablet Take 1 tablet by mouth daily. 04/05/22   Arthor Captain, PA-C      Allergies    Tramadol    Review of Systems   Review of Systems  Constitutional:  Negative for appetite change, chills, fatigue and fever.  HENT:  Negative for congestion and trouble swallowing.   Respiratory:  Negative for cough, chest tightness and shortness of breath.   Cardiovascular:  Negative for chest pain, palpitations and leg swelling.  Gastrointestinal:  Positive for abdominal pain and nausea. Negative for diarrhea and vomiting.  Genitourinary:  Positive for flank pain. Negative for difficulty urinating and penile swelling.  Musculoskeletal:  Negative for arthralgias.  Neurological:  Negative for dizziness, tremors and weakness.    Physical Exam Updated Vital Signs BP (!) 182/86 (BP Location: Right Arm)   Pulse 67   Temp 98.1 F (36.7 C) (Oral)   Resp 17   Ht 5\' 8"  (1.727  m)   Wt 108.9 kg   SpO2 98%   BMI 36.49 kg/m  Physical Exam Constitutional:      Appearance: He is well-developed.  HENT:     Head: Normocephalic and atraumatic.     Mouth/Throat:     Mouth: Mucous membranes are moist.     Pharynx: Oropharynx is clear.  Eyes:     Extraocular Movements: Extraocular movements intact.     Pupils: Pupils are equal, round, and reactive to light.  Cardiovascular:     Rate and Rhythm: Normal rate.     Heart sounds: Normal heart sounds.  Pulmonary:     Effort: Pulmonary effort is normal.      Breath sounds: Normal breath sounds.  Abdominal:     General: Abdomen is flat. Bowel sounds are normal. There is no distension.     Palpations: Abdomen is soft. There is no mass.     Tenderness: There is abdominal tenderness in the right lower quadrant. There is right CVA tenderness. There is no guarding.  Skin:    General: Skin is warm and dry.     Capillary Refill: Capillary refill takes less than 2 seconds.  Neurological:     Mental Status: He is alert.     ED Results / Procedures / Treatments   Labs (all labs ordered are listed, but only abnormal results are displayed) Labs Reviewed  COMPREHENSIVE METABOLIC PANEL - Abnormal; Notable for the following components:      Result Value   Sodium 134 (*)    Glucose, Bld 201 (*)    Creatinine, Ser 1.52 (*)    GFR, Estimated 52 (*)    All other components within normal limits  CBC WITH DIFFERENTIAL/PLATELET - Abnormal; Notable for the following components:   WBC 12.6 (*)    Neutro Abs 9.7 (*)    Monocytes Absolute 1.1 (*)    All other components within normal limits  URINALYSIS, ROUTINE W REFLEX MICROSCOPIC - Abnormal; Notable for the following components:   Glucose, UA >=500 (*)    Bacteria, UA RARE (*)    All other components within normal limits    EKG None  Radiology CT Renal Stone Study  Result Date: 05/08/2022 CLINICAL DATA:  Right-sided flank pain with nausea and vomiting since Sunday. History of renal stones. EXAM: CT ABDOMEN AND PELVIS WITHOUT CONTRAST TECHNIQUE: Multidetector CT imaging of the abdomen and pelvis was performed following the standard protocol without IV contrast. RADIATION DOSE REDUCTION: This exam was performed according to the departmental dose-optimization program which includes automated exposure control, adjustment of the mA and/or kV according to patient size and/or use of iterative reconstruction technique. COMPARISON:  CT May 23, 2014 FINDINGS: Lower chest: No acute abnormality.  Tiny hiatal  hernia. Hepatobiliary: Hepatomegaly with diffuse hepatic steatosis. Gallbladder is unremarkable. No biliary ductal dilation. Pancreas: No pancreatic ductal dilation or evidence of acute inflammation. Spleen: No splenomegaly. Adrenals/Urinary Tract: Bilateral adrenal glands appear normal. Mild right-sided hydroureteronephrosis to the level of a 4 mm stone in the UVJ or layering dependently in the urinary bladder with prominent perinephric stranding. Additional nonobstructive bilateral renal stones measure up to 3 mm. Hypodense right lower pole renal lesion measures fluid density consistent with a cyst and considered benign requiring no independent imaging follow-up. Urinary bladder is unremarkable for degree of distension. Stomach/Bowel: No radiopaque enteric contrast material was administered. No pathologic dilation of small or large bowel. Appendix is not confidently identified in its entirety however there is no pericecal inflammation.  Left-sided colonic diverticulosis without findings of acute diverticulitis. Vascular/Lymphatic: Aortic atherosclerosis. No pathologically enlarged abdominal or pelvic lymph nodes. Reproductive: Prostate is unremarkable. Other: No significant abdominopelvic free fluid. Musculoskeletal: Multilevel degenerative changes spine. Mild degenerative change of the bilateral hips. IMPRESSION: 1. Right-sided obstructive uropathy with a 4 mm stone in the UVJ or layering dependently in the urinary bladder, suggest correlation with laboratory values to exclude superimposed infection. 2. Hepatomegaly with diffuse hepatic steatosis. 3. Left-sided colonic diverticulosis without findings of acute diverticulitis. 4.  Aortic Atherosclerosis (ICD10-I70.0). Electronically Signed   By: Dahlia Bailiff M.D.   On: 05/08/2022 10:39    Procedures Procedures   Medications Ordered in ED Medications - No data to display  ED Course/ Medical Decision Making/ A&P                           Medical Decision  Making  Donald Cole is a 61 y.o. male with a history of T2DM, HLD, HTN, G1DD presenting for flank pain. Differential includes UTI, pyelonephritis, kidney stone.   Patient overall well appearing on physical exam. History consistent with kidney stone as patient has had 2 previously and has been able to pass them without issue previously. CT renal stone showed a 34mm stone on the right without evidence of hydronephrosis. Labs with mild elevation of creatinine to 1.5. No signs of infection on UA and WBC only mildly elevated to 12.6.   Discussed case with urologist Dr. Gloriann Loan, who recommended outpatient follow-up and pain treatment. Discussed with patient who is agreeable to treatment with Flomax and Oxycodone as needed for pain. Patient provided urology follow-up information.    Final Clinical Impression(s) / ED Diagnoses Final diagnoses:  Right flank pain  Kidney stone    Rx / DC Orders ED Discharge Orders          Ordered    tamsulosin (FLOMAX) 0.4 MG CAPS capsule  Daily        05/08/22 1213    oxyCODONE (ROXICODONE) 5 MG immediate release tablet  Every 6 hours PRN        05/08/22 1213              Leoma Folds, DO 05/08/22 1237    Lacretia Leigh, MD 05/09/22 0710

## 2022-05-08 NOTE — ED Provider Triage Note (Signed)
Emergency Medicine Provider Triage Evaluation Note  Donald Cole , a 61 y.o. male  was evaluated in triage.  Pt complains of flank pain.  Symptoms started yesterday.  Reports pain in the right flank radiating into the lower abdomen and groin.  History of prior kidney stones and this feels similar.  Some associated nausea, some discomfort with urination but no hematuria.  No fevers or chills.  Has never had to have surgery for kidney stones before.  Review of Systems  Positive: Flank pain, nausea, dysuria Negative: Fevers, hematuria, chest pain, shortness of breath  Physical Exam  BP (!) 194/98 (BP Location: Right Arm)   Pulse 65   Temp 99.1 F (37.3 C) (Oral)   Resp 18   SpO2 97%  Gen:   Awake, no distress   Resp:  Normal effort  MSK:   Moves extremities without difficulty  Other:  Tenderness over right flank  Medical Decision Making  Medically screening exam initiated at 9:33 AM.  Appropriate orders placed.  Donald Cole was informed that the remainder of the evaluation will be completed by another provider, this initial triage assessment does not replace that evaluation, and the importance of remaining in the ED until their evaluation is complete.  Labs and CT renal stone study ordered   Donald Cole 05/08/22 1610

## 2022-05-08 NOTE — ED Triage Notes (Signed)
Pt. Stated, I have a kidney stone started yesterday. Left side pain. Ive had kidney stones before.

## 2022-05-08 NOTE — Discharge Instructions (Signed)
You were found to have a 35mm kidney stone. We are sending in a prescription for Flomax to help get the stone moving and also oxycodone for pain control. Make sure to take your blood pressure medications when you get home.   If you have acute worsening of the pain not controlled by the pain medication or start having fevers or feeling worse please come back. We have given you the information for the urologist in case you have concerns.

## 2022-05-30 ENCOUNTER — Other Ambulatory Visit: Payer: Self-pay | Admitting: Family Medicine

## 2022-09-10 ENCOUNTER — Other Ambulatory Visit: Payer: Self-pay

## 2022-09-10 ENCOUNTER — Encounter (HOSPITAL_BASED_OUTPATIENT_CLINIC_OR_DEPARTMENT_OTHER): Payer: Self-pay

## 2022-09-10 ENCOUNTER — Emergency Department (HOSPITAL_BASED_OUTPATIENT_CLINIC_OR_DEPARTMENT_OTHER): Payer: Commercial Managed Care - HMO

## 2022-09-10 ENCOUNTER — Emergency Department (HOSPITAL_BASED_OUTPATIENT_CLINIC_OR_DEPARTMENT_OTHER)
Admission: EM | Admit: 2022-09-10 | Discharge: 2022-09-11 | Disposition: A | Discharge status: Home / Self Care | Payer: Commercial Managed Care - HMO | Attending: Student | Admitting: Student

## 2022-09-10 DIAGNOSIS — M25561 Pain in right knee: Secondary | ICD-10-CM | POA: Diagnosis not present

## 2022-09-10 DIAGNOSIS — E119 Type 2 diabetes mellitus without complications: Secondary | ICD-10-CM | POA: Insufficient documentation

## 2022-09-10 DIAGNOSIS — I10 Essential (primary) hypertension: Secondary | ICD-10-CM | POA: Diagnosis not present

## 2022-09-10 DIAGNOSIS — Z79899 Other long term (current) drug therapy: Secondary | ICD-10-CM | POA: Insufficient documentation

## 2022-09-10 DIAGNOSIS — Z7984 Long term (current) use of oral hypoglycemic drugs: Secondary | ICD-10-CM | POA: Insufficient documentation

## 2022-09-10 DIAGNOSIS — Z96652 Presence of left artificial knee joint: Secondary | ICD-10-CM | POA: Insufficient documentation

## 2022-09-10 DIAGNOSIS — M1711 Unilateral primary osteoarthritis, right knee: Secondary | ICD-10-CM | POA: Diagnosis not present

## 2022-09-10 DIAGNOSIS — M25461 Effusion, right knee: Secondary | ICD-10-CM

## 2022-09-10 NOTE — ED Triage Notes (Signed)
Pt reports pain and swelling to his right knee, ongoing for 2 years. This episode started a week ago. Denies injury. He states he was told he needs surgery.

## 2022-09-10 NOTE — ED Notes (Signed)
Patient transported to X-ray 

## 2022-09-10 NOTE — ED Provider Notes (Signed)
[facility] EMERGENCY DEPARTMENT Provider Note  CSN: [id number] Arrival date & time: 09/10/22 2331  Chief Complaint(s) Knee Pain  HPI Donald Cole is a 62 y.o. male with PMH T2DM, HTN, HLD, left TKA approximately 1 year ago who presents emergency department for evaluation of right knee pain and swelling.  Patient states that over the last 4 to 5 days he has felt increased pressure and swelling in the right knee and a feeling of "popping and clicking" in the knee where he feels like it will intermittently get stuck.  He states that his pain is largely under control, and he denies any additional systemic symptoms or numbness, tingling, weakness or other neurologic complaints.   Past Medical History Past Medical History:  Diagnosis Date   Arthritis    Diabetes mellitus without complication (HCC)    GERD (gastroesophageal reflux disease)    History of kidney stones    Hypercholesterolemia    Hypertension    non compliant with amlodipine   Pre-diabetes    Rupture of UCL of right thumb    Patient Active Problem List   Diagnosis Date Noted   Primary osteoarthritis of left knee 01/10/2021   Osteochondral defect of femoral condyle 06/15/2020   Grade I diastolic dysfunction 70/17/7939   Hyperlipidemia associated with type 2 diabetes mellitus ([place]) 07/10/2019   Diabetes mellitus without complication ([place]) 03/00/9233   Benign essential HTN 07/08/2019   Gamekeeper's thumb of right hand 11/11/2018   Home Medication(s) Prior to Admission medications   Medication Sig Start Date End Date Taking? Authorizing Provider  rosuvastatin (CRESTOR) 10 MG tablet TAKE ONE TABLET AT NIGHT FOR CHOLESTEROL 12/25/21   Vivi Barrack, MD  amLODipine (NORVASC) 10 MG tablet Take 1 tablet (10 mg total) by mouth daily. 04/05/22   Margarita Mail, PA-C  amoxicillin-clavulanate (AUGMENTIN) 875-125 MG tablet Take 1 tablet by mouth every 12 (twelve) hours. 04/05/22   Harris, Vernie Shanks, PA-C   Menthol-Camphor (TIGER BALM ARTHRITIS RUB EX) Apply 1 application topically daily as needed (pain).    [provider]  metFORMIN (GLUCOPHAGE-XR) 500 MG 24 hr tablet Take 500 mg tablet daily week 1, take 500 mg tablet twice daily week 2, take two 500 mg tablet (1000 g) morning and one 500 mg tablet at night week 3, take 1000 mg (2 tablets) twice daily thereafter. 01/04/22   Raspet, Derry Skill, PA-C  Multiple Vitamins-Minerals (ONE-A-DAY ENERGY) TABS Take 1 tablet by mouth daily.    [provider]  OVER THE COUNTER MEDICATION Take 1 Scoop by mouth daily. Super beets    [provider]  tamsulosin (FLOMAX) 0.4 MG CAPS capsule Take 1 capsule (0.4 mg total) by mouth daily. 05/08/22   Lilland, Alana, DO  valsartan-hydrochlorothiazide (DIOVAN-HCT) 160-25 MG tablet Take 1 tablet by mouth daily. 04/05/22   Margarita Mail, PA-C  Past Surgical History Past Surgical History:  Procedure Laterality Date   LIGAMENT REPAIR Right 11/14/2018   Procedure: RIGHT THUMB ULNAR COLLATERAL LIGAMENT REPAIR;  Surgeon: Dairl Ponder, MD;  Location: [facility];  Service: Orthopedics;  Laterality: Right;   TOTAL KNEE ARTHROPLASTY Left 01/10/2021   Procedure: LEFT TOTAL KNEE ARTHROPLASTY;  Surgeon: Marcene Corning, MD;  Location: [facility];  Service: Orthopedics;  Laterality: Left;   WISDOM TOOTH EXTRACTION     Family History Family History  Problem Relation Age of Onset   Cancer Mother     Social History Social History   Tobacco Use   Smoking status: Never   Smokeless tobacco: Never  Vaping Use   Vaping Use: Never used  Substance Use Topics   Alcohol use: No   Drug use: No   Allergies Tramadol  Review of Systems Review of Systems  Musculoskeletal:  Positive for joint swelling.    Physical Exam Vital Signs  I have reviewed the triage  vital signs BP (!) 155/93 (BP Location: Right Arm)   Pulse 72   Temp 97.7 F (36.5 C)   Resp 18   Ht 5\' 8"  (1.727 m)   Wt 104.3 kg   SpO2 97%   BMI 34.97 kg/m   Physical Exam Constitutional:      General: He is not in acute distress.    Appearance: Normal appearance.  HENT:     Head: Normocephalic and atraumatic.     Nose: No congestion or rhinorrhea.  Eyes:     General:        Right eye: No discharge.        Left eye: No discharge.     Extraocular Movements: Extraocular movements intact.     Pupils: Pupils are equal, round, and reactive to light.  Cardiovascular:     Rate and Rhythm: Normal rate and regular rhythm.     Heart sounds: No murmur heard. Pulmonary:     Effort: No respiratory distress.     Breath sounds: No wheezing or rales.  Abdominal:     General: There is no distension.     Tenderness: There is no abdominal tenderness.  Musculoskeletal:        General: Swelling present. Normal range of motion.     Cervical back: Normal range of motion.  Skin:    General: Skin is warm and dry.  Neurological:     General: No focal deficit present.     Mental Status: He is alert.     ED Results and Treatments Labs (all labs ordered are listed, but only abnormal results are displayed) Labs Reviewed - No data to display                                                                                                                        Radiology No results found.  Pertinent labs & imaging results that were available during my care of the patient were reviewed by me and considered  in my medical decision making (see MDM for details).  Medications Ordered in ED Medications - No data to display                                                                                                                                   Procedures Procedures  (including critical care time)  Medical Decision Making / ED Course   This patient presents to the ED for concern of  knee pain and swelling, this involves an extensive number of treatment options, and is a complaint that carries with it a high risk of complications and morbidity.  The differential diagnosis includes progression of osteoarthritis, fracture, gout, pseudogout, Baker's cyst,  MDM: Patient seen the emergency room for evaluation of knee pain and swelling.  Physical exam with small joint effusion but no erythema or warmth.  Minimal tenderness to palpation.  X-ray imaging with tricompartmental osteoarthritis.  Patient encouraged to use ice and compression with a knee brace and follow-up outpatient with his orthopedist.  I have placed a consult with his primary orthopedist. patient then discharged   Additional history obtained:  -External records from outside source obtained and reviewed including: Chart review including previous notes, labs, imaging, consultation notes    Imaging Studies ordered: I ordered imaging studies including knee x-ray I independently visualized and interpreted imaging. I agree with the radiologist interpretation   Medicines ordered and prescription drug management: No orders of the defined types were placed in this encounter.   -I have reviewed the patients home medicines and have made adjustments as needed  Critical interventions none    Cardiac Monitoring: The patient was maintained on a cardiac monitor.  I personally viewed and interpreted the cardiac monitored which showed an underlying rhythm of: NSR  Social Determinants of Health:  Factors impacting patients care include: none   Reevaluation: After the interventions noted above, I reevaluated the patient and found that they have :stayed the same  Co morbidities that complicate the patient evaluation  Past Medical History:  Diagnosis Date   Arthritis    Diabetes mellitus without complication (HCC)    GERD (gastroesophageal reflux disease)    History of kidney stones    Hypercholesterolemia     Hypertension    non compliant with amlodipine   Pre-diabetes    Rupture of UCL of right thumb       Dispostion: I considered admission for this patient, but he does not meet inpatient criteria for admission he is safe for discharge with outpatient follow-up     Final Clinical Impression(s) / ED Diagnoses Final diagnoses:  None     @PCDICTATION @    [place], Debe Coder, MD 09/11/22 [phone]

## 2022-09-11 NOTE — ED Notes (Signed)
Pt A&OX4 ambulatory at d/c with independent steady gait. Pt verbalized understanding of d/c instructions and follow up care. 

## 2022-10-19 ENCOUNTER — Other Ambulatory Visit: Payer: Self-pay

## 2022-10-19 ENCOUNTER — Emergency Department (HOSPITAL_BASED_OUTPATIENT_CLINIC_OR_DEPARTMENT_OTHER)
Admission: EM | Admit: 2022-10-19 | Discharge: 2022-10-19 | Disposition: A | Discharge status: Home / Self Care | Payer: Medicaid Other | Attending: Emergency Medicine | Admitting: Emergency Medicine

## 2022-10-19 DIAGNOSIS — Z7984 Long term (current) use of oral hypoglycemic drugs: Secondary | ICD-10-CM | POA: Insufficient documentation

## 2022-10-19 DIAGNOSIS — E1165 Type 2 diabetes mellitus with hyperglycemia: Secondary | ICD-10-CM | POA: Insufficient documentation

## 2022-10-19 DIAGNOSIS — R350 Frequency of micturition: Secondary | ICD-10-CM | POA: Diagnosis present

## 2022-10-19 DIAGNOSIS — B370 Candidal stomatitis: Secondary | ICD-10-CM | POA: Insufficient documentation

## 2022-10-19 DIAGNOSIS — R739 Hyperglycemia, unspecified: Secondary | ICD-10-CM

## 2022-10-19 LAB — CBC
HCT: 43.4 % (ref 39.0–52.0)
Hemoglobin: 14.6 g/dL (ref 13.0–17.0)
MCH: 28.7 pg (ref 26.0–34.0)
MCHC: 33.6 g/dL (ref 30.0–36.0)
MCV: 85.3 fL (ref 80.0–100.0)
Platelets: 246 10*3/uL (ref 150–400)
RBC: 5.09 MIL/uL (ref 4.22–5.81)
RDW: 12.4 % (ref 11.5–15.5)
WBC: 9.6 10*3/uL (ref 4.0–10.5)
nRBC: 0 % (ref 0.0–0.2)

## 2022-10-19 LAB — URINALYSIS, ROUTINE W REFLEX MICROSCOPIC
Bilirubin Urine: NEGATIVE
Glucose, UA: >=500 mg/dL — AB
Hgb urine dipstick: NEGATIVE
Ketones, ur: NEGATIVE mg/dL
Leukocytes,Ua: NEGATIVE
Nitrite: NEGATIVE
Protein, ur: NEGATIVE mg/dL
Specific Gravity, Urine: 1.015 (ref 1.005–1.030)
pH: 5 (ref 5.0–8.0)

## 2022-10-19 LAB — CBG MONITORING, ED
Glucose-Capillary: 349 mg/dL — ABNORMAL HIGH (ref 70–99)
Glucose-Capillary: 285 mg/dL — ABNORMAL HIGH (ref 70–99)

## 2022-10-19 LAB — BASIC METABOLIC PANEL
Anion gap: 8 (ref 5–15)
BUN: 15 mg/dL (ref 6–20)
CO2: 25 mmol/L (ref 22–32)
Calcium: 8.8 mg/dL — ABNORMAL LOW (ref 8.9–10.3)
Chloride: 96 mmol/L — ABNORMAL LOW (ref 98–111)
Creatinine, Ser: 0.86 mg/dL (ref 0.61–1.24)
GFR, Estimated: >60 mL/min (ref >60)
Glucose, Bld: 349 mg/dL — ABNORMAL HIGH (ref 70–99)
Potassium: 3.9 mmol/L (ref 3.5–5.1)
Sodium: 129 mmol/L — ABNORMAL LOW (ref 135–145)

## 2022-10-19 LAB — URINALYSIS, MICROSCOPIC (REFLEX)

## 2022-10-19 MED ORDER — LACTATED RINGERS IV BOLUS
1000.0000 mL | Freq: Once | INTRAVENOUS | Status: AC
  Administered 2022-10-19: 1000 mL via INTRAVENOUS

## 2022-10-19 MED ORDER — INSULIN ASPART 100 UNIT/ML IJ SOLN: 4.0000 [IU] | INTRAMUSCULAR | Status: DC

## 2022-10-19 MED ORDER — INSULIN ASPART 100 UNIT/ML IJ SOLN
3.0000 [IU] | INTRAMUSCULAR | Status: AC
  Administered 2022-10-19: 3 [IU] via SUBCUTANEOUS

## 2022-10-19 MED ORDER — NYSTATIN 100000 UNIT/ML MT SUSP: 500000.0000 [IU] | Freq: Four times a day (QID) | OROMUCOSAL | 0 refills | Status: AC

## 2022-10-19 MED ORDER — NYSTATIN 100000 UNIT/ML MT SUSP: 5.0000 mL | OROMUCOSAL | Status: DC

## 2022-10-19 NOTE — ED Provider Notes (Signed)
[facility] [place] Provider Note   CSN: [id number] Arrival date & time: 10/19/22  1604     History  Chief Complaint  Patient presents with   Blurred Vision    Donald Cole is a 62 y.o. male.  62 year old male with a history of DM2 on metformin who presents emergency department with urinary frequency and changes in tongue sensation.  Reports that for the past 3 or 4 days he has had increasing urinary frequency.  Also reports that he has had occasional blurry vision and is concerned about his blood sugars.  Says that he is not having any dysuria.  Says that today he noticed that he had a burning sensation in his tongue after eating some food and decided to come into the emergency department for evaluation.  No shortness of breath.  No numbness or weakness of his arms or legs.  No recent illnesses.  Is not on any inhaled steroids.       Home Medications Prior to Admission medications   Medication Sig Start Date End Date Taking? Authorizing Provider  rosuvastatin (CRESTOR) 10 MG tablet TAKE ONE TABLET AT NIGHT FOR CHOLESTEROL 12/25/21   Vivi Barrack, MD  amLODipine (NORVASC) 10 MG tablet Take 1 tablet (10 mg total) by mouth daily. 04/05/22   Margarita Mail, PA-C  amoxicillin-clavulanate (AUGMENTIN) 875-125 MG tablet Take 1 tablet by mouth every 12 (twelve) hours. 04/05/22   Harris, Vernie Shanks, PA-C  Menthol-Camphor (TIGER BALM ARTHRITIS RUB EX) Apply 1 application topically daily as needed (pain).    [provider]  metFORMIN (GLUCOPHAGE-XR) 500 MG 24 hr tablet Take 500 mg tablet daily week 1, take 500 mg tablet twice daily week 2, take two 500 mg tablet (1000 g) morning and one 500 mg tablet at night week 3, take 1000 mg (2 tablets) twice daily thereafter. 01/04/22   Raspet, Derry Skill, PA-C  Multiple Vitamins-Minerals (ONE-A-DAY ENERGY) TABS Take 1 tablet by mouth daily.    [provider]  OVER THE COUNTER MEDICATION Take 1 Scoop  by mouth daily. Super beets    [provider]  tamsulosin (FLOMAX) 0.4 MG CAPS capsule Take 1 capsule (0.4 mg total) by mouth daily. 05/08/22   Lilland, Alana, DO  valsartan-hydrochlorothiazide (DIOVAN-HCT) 160-25 MG tablet Take 1 tablet by mouth daily. 04/05/22   Margarita Mail, PA-C      Allergies    Tramadol    Review of Systems   Review of Systems  Physical Exam Updated Vital Signs BP (!) 170/93   Pulse 73   Temp 98.8 F (37.1 C) (Oral)   Resp 17   Ht 5' 8"$  (1.727 m)   Wt 104.3 kg   SpO2 98%   BMI 34.97 kg/m  Physical Exam Vitals and nursing note reviewed.  Constitutional:      General: He is not in acute distress.    Appearance: He is well-developed.  HENT:     Head: Normocephalic and atraumatic.     Right Ear: External ear normal.     Left Ear: External ear normal.     Nose: Nose normal.     Mouth/Throat:     Comments: Thrush noted on tongue Eyes:     Extraocular Movements: Extraocular movements intact.     Conjunctiva/sclera: Conjunctivae normal.     Pupils: Pupils are equal, round, and reactive to light.  Cardiovascular:     Rate and Rhythm: Normal rate and regular rhythm.     Heart  sounds: Normal heart sounds.  Pulmonary:     Effort: Pulmonary effort is normal. No respiratory distress.     Breath sounds: Normal breath sounds.  Abdominal:     General: There is no distension.     Palpations: Abdomen is soft. There is no mass.     Tenderness: There is no abdominal tenderness. There is no guarding.  Musculoskeletal:     Cervical back: Normal range of motion and neck supple.     Right lower leg: No edema.     Left lower leg: No edema.  Skin:    General: Skin is warm and dry.  Neurological:     General: No focal deficit present.     Mental Status: He is alert and oriented to person, place, and time. Mental status is at baseline.     Cranial Nerves: No cranial nerve deficit.     Sensory: No sensory deficit.     Motor: No weakness.  Psychiatric:         Mood and Affect: Mood normal.        Behavior: Behavior normal.     ED Results / Procedures / Treatments   Labs (all labs ordered are listed, but only abnormal results are displayed) Labs Reviewed  BASIC METABOLIC PANEL - Abnormal; Notable for the following components:      Result Value   Sodium 129 (*)    Chloride 96 (*)    Glucose, Bld 349 (*)    Calcium 8.8 (*)    All other components within normal limits  URINALYSIS, ROUTINE W REFLEX MICROSCOPIC - Abnormal; Notable for the following components:   Glucose, UA >=500 (*)    All other components within normal limits  URINALYSIS, MICROSCOPIC (REFLEX) - Abnormal; Notable for the following components:   Bacteria, UA RARE (*)    All other components within normal limits  CBG MONITORING, ED - Abnormal; Notable for the following components:   Glucose-Capillary 349 (*)    All other components within normal limits  CBC    EKG None  Radiology No results found.  Procedures Procedures  {Document cardiac monitor, telemetry assessment procedure when appropriate:1}  Medications Ordered in ED Medications - No data to display  ED Course/ Medical Decision Making/ A&P   {   Click here for ABCD2, HEART and other calculatorsREFRESH Note before signing :1}                          Medical Decision Making Amount and/or Complexity of Data Reviewed Labs: ordered.  Risk Prescription drug management.   ***  {Document critical care time when appropriate:1} {Document review of labs and clinical decision tools ie heart score, Chads2Vasc2 etc:1}  {Document your independent review of radiology images, and any outside records:1} {Document your discussion with family members, caretakers, and with consultants:1} {Document social determinants of health affecting pt's care:1} {Document your decision making why or why not admission, treatments were needed:1} Final Clinical Impression(s) / ED Diagnoses Final diagnoses:  None    Rx  / DC Orders ED Discharge Orders     None

## 2022-10-19 NOTE — ED Triage Notes (Signed)
Patient presents to ED via POV from home. Here with blurred vision and urinary frequency x 4 days. Expresses concern for diabetes.

## 2022-10-19 NOTE — Discharge Instructions (Addendum)
You were seen for your elevated blood sugars in the emergency department.   At home, please continue to take your metformin and stay well-hydrated.  Please take the nystatin suspension we have given you for your thrush on your tongue.    Follow-up with your primary doctor in 2-3 days regarding your visit.    Return immediately to the emergency department if you experience any of the following: Shortness of breath, fevers, vomiting, dizziness, or any other concerning symptoms.    Thank you for visiting our Emergency Department. It was a pleasure taking care of you today.

## 2022-10-19 NOTE — ED Notes (Signed)
D/c paperwork reviewed with pt, including prescriptions and follow up care.  All questions and/or concerns addressed at time of d/c.  No further needs expressed. . Pt verbalized understanding, Ambulatory without assistance to ED exit, NAD.

## 2022-10-19 NOTE — ED Notes (Signed)
Bladder scan 0ml

## 2022-11-12 DIAGNOSIS — E039 Hypothyroidism, unspecified: Secondary | ICD-10-CM | POA: Diagnosis not present

## 2022-11-12 DIAGNOSIS — Z131 Encounter for screening for diabetes mellitus: Secondary | ICD-10-CM | POA: Diagnosis not present

## 2022-11-12 DIAGNOSIS — Z125 Encounter for screening for malignant neoplasm of prostate: Secondary | ICD-10-CM | POA: Diagnosis not present

## 2022-11-12 DIAGNOSIS — Z6834 Body mass index (BMI) 34.0-34.9, adult: Secondary | ICD-10-CM | POA: Diagnosis not present

## 2022-11-12 DIAGNOSIS — Z0000 Encounter for general adult medical examination without abnormal findings: Secondary | ICD-10-CM | POA: Diagnosis not present

## 2022-11-12 DIAGNOSIS — Z1159 Encounter for screening for other viral diseases: Secondary | ICD-10-CM | POA: Diagnosis not present

## 2022-11-12 DIAGNOSIS — Z1211 Encounter for screening for malignant neoplasm of colon: Secondary | ICD-10-CM | POA: Diagnosis not present

## 2022-11-12 DIAGNOSIS — R351 Nocturia: Secondary | ICD-10-CM | POA: Diagnosis not present

## 2022-11-12 DIAGNOSIS — R3915 Urgency of urination: Secondary | ICD-10-CM | POA: Diagnosis not present

## 2022-11-12 DIAGNOSIS — R0602 Shortness of breath: Secondary | ICD-10-CM | POA: Diagnosis not present

## 2022-11-26 DIAGNOSIS — E7800 Pure hypercholesterolemia, unspecified: Secondary | ICD-10-CM | POA: Diagnosis not present

## 2022-11-26 DIAGNOSIS — I10 Essential (primary) hypertension: Secondary | ICD-10-CM | POA: Diagnosis not present

## 2022-11-26 DIAGNOSIS — E1165 Type 2 diabetes mellitus with hyperglycemia: Secondary | ICD-10-CM | POA: Diagnosis not present

## 2022-11-26 DIAGNOSIS — Z6834 Body mass index (BMI) 34.0-34.9, adult: Secondary | ICD-10-CM | POA: Diagnosis not present

## 2022-12-01 ENCOUNTER — Other Ambulatory Visit: Payer: Self-pay | Admitting: Family Medicine

## 2022-12-05 DIAGNOSIS — E119 Type 2 diabetes mellitus without complications: Secondary | ICD-10-CM | POA: Diagnosis not present

## 2022-12-05 DIAGNOSIS — Z1211 Encounter for screening for malignant neoplasm of colon: Secondary | ICD-10-CM | POA: Diagnosis not present

## 2022-12-05 DIAGNOSIS — K635 Polyp of colon: Secondary | ICD-10-CM | POA: Diagnosis not present

## 2022-12-14 DIAGNOSIS — K635 Polyp of colon: Secondary | ICD-10-CM | POA: Diagnosis not present

## 2022-12-24 ENCOUNTER — Encounter: Payer: Self-pay | Admitting: *Deleted

## 2022-12-27 DIAGNOSIS — E119 Type 2 diabetes mellitus without complications: Secondary | ICD-10-CM | POA: Diagnosis not present

## 2022-12-27 DIAGNOSIS — R599 Enlarged lymph nodes, unspecified: Secondary | ICD-10-CM | POA: Diagnosis not present

## 2022-12-27 DIAGNOSIS — Z6834 Body mass index (BMI) 34.0-34.9, adult: Secondary | ICD-10-CM | POA: Diagnosis not present

## 2022-12-27 DIAGNOSIS — I10 Essential (primary) hypertension: Secondary | ICD-10-CM | POA: Diagnosis not present

## 2022-12-27 DIAGNOSIS — E7800 Pure hypercholesterolemia, unspecified: Secondary | ICD-10-CM | POA: Diagnosis not present

## 2023-02-14 DIAGNOSIS — E119 Type 2 diabetes mellitus without complications: Secondary | ICD-10-CM | POA: Diagnosis not present

## 2023-02-26 DIAGNOSIS — I10 Essential (primary) hypertension: Secondary | ICD-10-CM | POA: Diagnosis not present

## 2023-02-26 DIAGNOSIS — E119 Type 2 diabetes mellitus without complications: Secondary | ICD-10-CM | POA: Diagnosis not present

## 2023-02-26 DIAGNOSIS — Z6836 Body mass index (BMI) 36.0-36.9, adult: Secondary | ICD-10-CM | POA: Diagnosis not present

## 2023-02-26 DIAGNOSIS — E7800 Pure hypercholesterolemia, unspecified: Secondary | ICD-10-CM | POA: Diagnosis not present

## 2023-03-25 ENCOUNTER — Encounter (HOSPITAL_COMMUNITY): Payer: Self-pay

## 2023-03-25 ENCOUNTER — Other Ambulatory Visit: Payer: Self-pay

## 2023-03-25 ENCOUNTER — Emergency Department (HOSPITAL_COMMUNITY)
Admission: EM | Admit: 2023-03-25 | Discharge: 2023-03-26 | Disposition: A | Discharge status: Home / Self Care | Payer: Medicaid Other | Attending: Emergency Medicine | Admitting: Emergency Medicine

## 2023-03-25 DIAGNOSIS — I10 Essential (primary) hypertension: Secondary | ICD-10-CM | POA: Diagnosis not present

## 2023-03-25 DIAGNOSIS — Z79899 Other long term (current) drug therapy: Secondary | ICD-10-CM | POA: Insufficient documentation

## 2023-03-25 DIAGNOSIS — E119 Type 2 diabetes mellitus without complications: Secondary | ICD-10-CM | POA: Diagnosis not present

## 2023-03-25 DIAGNOSIS — Z7984 Long term (current) use of oral hypoglycemic drugs: Secondary | ICD-10-CM | POA: Insufficient documentation

## 2023-03-25 DIAGNOSIS — R0789 Other chest pain: Secondary | ICD-10-CM | POA: Insufficient documentation

## 2023-03-25 DIAGNOSIS — R079 Chest pain, unspecified: Secondary | ICD-10-CM | POA: Diagnosis not present

## 2023-03-25 DIAGNOSIS — R0781 Pleurodynia: Secondary | ICD-10-CM | POA: Diagnosis present

## 2023-03-25 NOTE — ED Triage Notes (Signed)
Says he has right lower rib pain and suspects pulled muscle. Pain spasms with movement.   Has history of renal colic but says that pain is manifesting differently and does not suspect that to be the issue.   Request to hold off on labs temporarily.

## 2023-03-26 ENCOUNTER — Emergency Department (HOSPITAL_COMMUNITY): Payer: Medicaid Other

## 2023-03-26 DIAGNOSIS — R079 Chest pain, unspecified: Secondary | ICD-10-CM | POA: Diagnosis not present

## 2023-03-26 LAB — CBC WITH DIFFERENTIAL/PLATELET
Abs Immature Granulocytes: 0.03 10*3/uL (ref 0.00–0.07)
Basophils Absolute: 0.1 10*3/uL (ref 0.0–0.1)
Basophils Relative: 1 %
Eosinophils Absolute: 0.2 10*3/uL (ref 0.0–0.5)
Eosinophils Relative: 3 %
HCT: 45.9 % (ref 39.0–52.0)
Hemoglobin: 14.6 g/dL (ref 13.0–17.0)
Immature Granulocytes: 0 %
Lymphocytes Relative: 29 %
Lymphs Abs: 2.6 10*3/uL (ref 0.7–4.0)
MCH: 27.7 pg (ref 26.0–34.0)
MCHC: 31.8 g/dL (ref 30.0–36.0)
MCV: 87.1 fL (ref 80.0–100.0)
Monocytes Absolute: 0.6 10*3/uL (ref 0.1–1.0)
Monocytes Relative: 6 %
Neutro Abs: 5.4 10*3/uL (ref 1.7–7.7)
Neutrophils Relative %: 61 %
Platelets: 267 10*3/uL (ref 150–400)
RBC: 5.27 MIL/uL (ref 4.22–5.81)
RDW: 13.5 % (ref 11.5–15.5)
WBC: 8.9 10*3/uL (ref 4.0–10.5)
nRBC: 0 % (ref 0.0–0.2)

## 2023-03-26 LAB — COMPREHENSIVE METABOLIC PANEL
ALT: 32 U/L (ref 0–44)
AST: 30 U/L (ref 15–41)
Albumin: 4.2 g/dL (ref 3.5–5.0)
Alkaline Phosphatase: 76 U/L (ref 38–126)
Anion gap: 9 (ref 5–15)
BUN: 18 mg/dL (ref 6–20)
CO2: 27 mmol/L (ref 22–32)
Calcium: 9.1 mg/dL (ref 8.9–10.3)
Chloride: 100 mmol/L (ref 98–111)
Creatinine, Ser: 0.98 mg/dL (ref 0.61–1.24)
GFR, Estimated: >60 mL/min (ref >60)
Glucose, Bld: 199 mg/dL — ABNORMAL HIGH (ref 70–99)
Potassium: 3.8 mmol/L (ref 3.5–5.1)
Sodium: 136 mmol/L (ref 135–145)
Total Bilirubin: 0.6 mg/dL (ref 0.3–1.2)
Total Protein: 7.9 g/dL (ref 6.5–8.1)

## 2023-03-26 LAB — URINALYSIS, ROUTINE W REFLEX MICROSCOPIC
Bacteria, UA: NONE SEEN
Bilirubin Urine: NEGATIVE
Glucose, UA: >=500 mg/dL — AB
Hgb urine dipstick: NEGATIVE
Ketones, ur: NEGATIVE mg/dL
Leukocytes,Ua: NEGATIVE
Nitrite: NEGATIVE
Protein, ur: NEGATIVE mg/dL
Specific Gravity, Urine: 1.032 — ABNORMAL HIGH (ref 1.005–1.030)
pH: 5 (ref 5.0–8.0)

## 2023-03-26 LAB — TROPONIN I (HIGH SENSITIVITY): Troponin I (High Sensitivity): 7 ng/L (ref <18)

## 2023-03-26 MED ORDER — LIDOCAINE 5 % EX PTCH
1.0000 | MEDICATED_PATCH | CUTANEOUS | Status: DC
  Administered 2023-03-26: 1 via TRANSDERMAL
  Filled 2023-03-26: qty 1

## 2023-03-26 MED ORDER — DICLOFENAC SODIUM 1 % EX GEL: 4.0000 g | Freq: Four times a day (QID) | CUTANEOUS | 0 refills | Status: AC | PRN

## 2023-03-26 NOTE — ED Provider Notes (Signed)
[name] DEPARTMENT AT [facility] Provider Note   CSN: [id number] Arrival date & time: 03/25/23  2233     History  Chief Complaint  Patient presents with   Right Rib Pain    Donald Cole is a 62 y.o. male.  The history is provided by the patient.  Donald Cole is a 62 y.o. male who presents to the Emergency Department complaining of flank pain.  He presents to the ED for evaluation of right sided chest/abdominal pain that started one year ago.  It worsened 1 week ago.  He describes it as a pulling/burning sensation when he moves.  Today he stepped in a hole and twisted and he feels like it is worse.  Pain resolves at rest.  No fever, chest pain, sob, AP, dysuria, N/V, hematuria.    Hx/o HTN, DM.      Home Medications Prior to Admission medications   Medication Sig Start Date End Date Taking? Authorizing Provider  diclofenac Sodium (VOLTAREN) 1 % GEL Apply 4 g topically 4 (four) times daily as needed. 03/26/23  Yes Tilden Fossa, MD  rosuvastatin (CRESTOR) 10 MG tablet TAKE ONE TABLET AT NIGHT FOR CHOLESTEROL 12/25/21   Ardith Dark, MD  amLODipine (NORVASC) 10 MG tablet Take 1 tablet (10 mg total) by mouth daily. 04/05/22   Arthor Captain, PA-C  amoxicillin-clavulanate (AUGMENTIN) 875-125 MG tablet Take 1 tablet by mouth every 12 (twelve) hours. 04/05/22   Harris, Cammy Copa, PA-C  Menthol-Camphor (TIGER BALM ARTHRITIS RUB EX) Apply 1 application topically daily as needed (pain).    [provider]  metFORMIN (GLUCOPHAGE-XR) 500 MG 24 hr tablet Take 500 mg tablet daily week 1, take 500 mg tablet twice daily week 2, take two 500 mg tablet (1000 g) morning and one 500 mg tablet at night week 3, take 1000 mg (2 tablets) twice daily thereafter. 01/04/22   Raspet, Noberto Retort, PA-C  Multiple Vitamins-Minerals (ONE-A-DAY ENERGY) TABS Take 1 tablet by mouth daily.    [provider]  nystatin (MYCOSTATIN) 100000 UNIT/ML suspension Take 5 mLs  (500,000 Units total) by mouth 4 (four) times daily. 10/19/22   Rondel Baton, MD  OVER THE COUNTER MEDICATION Take 1 Scoop by mouth daily. Super beets    [provider]  tamsulosin (FLOMAX) 0.4 MG CAPS capsule Take 1 capsule (0.4 mg total) by mouth daily. 05/08/22   Lilland, Alana, DO  valsartan-hydrochlorothiazide (DIOVAN-HCT) 160-25 MG tablet Take 1 tablet by mouth daily. 04/05/22   Arthor Captain, PA-C      Allergies    Tramadol    Review of Systems   Review of Systems  All other systems reviewed and are negative.   Physical Exam Updated Vital Signs BP (!) 159/69   Pulse (!) 58   Temp 97.9 F (36.6 C) (Oral)   Resp 18   Ht 5\' 8"  (1.727 m)   Wt 104.3 kg   SpO2 98%   BMI 34.97 kg/m  Physical Exam Vitals and nursing note reviewed.  Constitutional:      Appearance: He is well-developed.  HENT:     Head: Normocephalic and atraumatic.  Cardiovascular:     Rate and Rhythm: Normal rate and regular rhythm.     Heart sounds: No murmur heard. Pulmonary:     Effort: Pulmonary effort is normal. No respiratory distress.     Breath sounds: Normal breath sounds.  Chest:     Chest wall: No tenderness.  Abdominal:  Palpations: Abdomen is soft.     Tenderness: There is no abdominal tenderness. There is no guarding or rebound.  Musculoskeletal:        General: No swelling or tenderness.  Skin:    General: Skin is warm and dry.  Neurological:     Mental Status: He is alert and oriented to person, place, and time.  Psychiatric:        Behavior: Behavior normal.     ED Results / Procedures / Treatments   Labs (all labs ordered are listed, but only abnormal results are displayed) Labs Reviewed  COMPREHENSIVE METABOLIC PANEL - Abnormal; Notable for the following components:      Result Value   Glucose, Bld 199 (*)    All other components within normal limits  URINALYSIS, ROUTINE W REFLEX MICROSCOPIC - Abnormal; Notable for the following components:   Specific  Gravity, Urine 1.032 (*)    Glucose, UA >=500 (*)    All other components within normal limits  CBC WITH DIFFERENTIAL/PLATELET  TROPONIN I (HIGH SENSITIVITY)    EKG EKG Interpretation Date/Time:  Tuesday March 26 2023 00:14:52 EDT Ventricular Rate:  60 PR Interval:  217 QRS Duration:  93 QT Interval:  394 QTC Calculation: 394 R Axis:   29  Text Interpretation: Sinus rhythm Prolonged PR interval Anterior infarct, old Minimal ST depression, inferior leads Confirmed by Tilden Fossa [id number]) on 03/26/2023 12:17:55 AM  Radiology DG Chest 2 View  Result Date: 03/26/2023 CLINICAL DATA:  Right-sided chest pain EXAM: CHEST - 2 VIEW COMPARISON:  Radiographs 01/05/2021 FINDINGS: The heart size and mediastinal contours are within normal limits. Both lungs are clear. The visualized skeletal structures are unremarkable. IMPRESSION: No active cardiopulmonary disease. Electronically Signed   By: Minerva Fester M.D.   On: 03/26/2023 01:37    Procedures Procedures    Medications Ordered in ED Medications  lidocaine (LIDODERM) 5 % 1 patch (1 patch Transdermal Patch Applied 03/26/23 0115)    ED Course/ Medical Decision Making/ A&P                             Medical Decision Making Amount and/or Complexity of Data Reviewed Labs: ordered. Radiology: ordered.  Risk Prescription drug management.   Pt here for evaluation of very localized right sided chest pain.  Pain is not reproducible on palpation, but is reproducible on movement.  EKG is without acute ischemic changes.  Current clinical picture is not ACS, PE, dissection, cholecystitis, renal colic.  Discussed with patient musculoskeletal chest wall pain.  Discussed outpatient follow-up and return precautions.        Final Clinical Impression(s) / ED Diagnoses Final diagnoses:  Chest wall pain    Rx / DC Orders ED Discharge Orders          Ordered    diclofenac Sodium (VOLTAREN) 1 % GEL  4 times daily PRN        03/26/23  0252              Tilden Fossa, MD 03/26/23 [phone]

## 2023-05-22 DIAGNOSIS — E559 Vitamin D deficiency, unspecified: Secondary | ICD-10-CM | POA: Diagnosis not present

## 2023-05-22 DIAGNOSIS — Z79899 Other long term (current) drug therapy: Secondary | ICD-10-CM | POA: Diagnosis not present

## 2023-05-22 DIAGNOSIS — E119 Type 2 diabetes mellitus without complications: Secondary | ICD-10-CM | POA: Diagnosis not present

## 2023-05-22 DIAGNOSIS — I10 Essential (primary) hypertension: Secondary | ICD-10-CM | POA: Diagnosis not present

## 2023-05-22 DIAGNOSIS — R351 Nocturia: Secondary | ICD-10-CM | POA: Diagnosis not present

## 2023-05-22 DIAGNOSIS — R5383 Other fatigue: Secondary | ICD-10-CM | POA: Diagnosis not present

## 2023-06-21 DIAGNOSIS — M7989 Other specified soft tissue disorders: Secondary | ICD-10-CM | POA: Diagnosis not present

## 2023-06-21 DIAGNOSIS — R9431 Abnormal electrocardiogram [ECG] [EKG]: Secondary | ICD-10-CM | POA: Diagnosis not present

## 2023-06-21 DIAGNOSIS — G4739 Other sleep apnea: Secondary | ICD-10-CM | POA: Diagnosis not present

## 2023-06-21 DIAGNOSIS — I10 Essential (primary) hypertension: Secondary | ICD-10-CM | POA: Diagnosis not present

## 2023-06-21 DIAGNOSIS — R0789 Other chest pain: Secondary | ICD-10-CM | POA: Diagnosis not present

## 2023-06-21 DIAGNOSIS — Z6836 Body mass index (BMI) 36.0-36.9, adult: Secondary | ICD-10-CM | POA: Diagnosis not present

## 2023-06-21 DIAGNOSIS — E119 Type 2 diabetes mellitus without complications: Secondary | ICD-10-CM | POA: Diagnosis not present

## 2023-06-21 DIAGNOSIS — I2510 Atherosclerotic heart disease of native coronary artery without angina pectoris: Secondary | ICD-10-CM | POA: Diagnosis not present

## 2023-06-25 DIAGNOSIS — I2510 Atherosclerotic heart disease of native coronary artery without angina pectoris: Secondary | ICD-10-CM | POA: Diagnosis not present

## 2023-06-25 DIAGNOSIS — R9431 Abnormal electrocardiogram [ECG] [EKG]: Secondary | ICD-10-CM | POA: Diagnosis not present

## 2023-06-25 DIAGNOSIS — M7989 Other specified soft tissue disorders: Secondary | ICD-10-CM | POA: Diagnosis not present

## 2023-06-28 DIAGNOSIS — E119 Type 2 diabetes mellitus without complications: Secondary | ICD-10-CM | POA: Diagnosis not present

## 2023-06-28 DIAGNOSIS — I10 Essential (primary) hypertension: Secondary | ICD-10-CM | POA: Diagnosis not present

## 2023-06-28 DIAGNOSIS — Z6836 Body mass index (BMI) 36.0-36.9, adult: Secondary | ICD-10-CM | POA: Diagnosis not present

## 2023-06-28 DIAGNOSIS — E785 Hyperlipidemia, unspecified: Secondary | ICD-10-CM | POA: Diagnosis not present

## 2023-07-01 DIAGNOSIS — R9431 Abnormal electrocardiogram [ECG] [EKG]: Secondary | ICD-10-CM | POA: Diagnosis not present

## 2023-07-08 DIAGNOSIS — M25561 Pain in right knee: Secondary | ICD-10-CM | POA: Diagnosis not present

## 2023-07-08 DIAGNOSIS — E119 Type 2 diabetes mellitus without complications: Secondary | ICD-10-CM | POA: Diagnosis not present

## 2023-07-08 DIAGNOSIS — Z6836 Body mass index (BMI) 36.0-36.9, adult: Secondary | ICD-10-CM | POA: Diagnosis not present

## 2023-07-11 DIAGNOSIS — Z6836 Body mass index (BMI) 36.0-36.9, adult: Secondary | ICD-10-CM | POA: Diagnosis not present

## 2023-07-11 DIAGNOSIS — E119 Type 2 diabetes mellitus without complications: Secondary | ICD-10-CM | POA: Diagnosis not present

## 2023-07-11 DIAGNOSIS — I10 Essential (primary) hypertension: Secondary | ICD-10-CM | POA: Diagnosis not present

## 2023-07-11 DIAGNOSIS — E6609 Other obesity due to excess calories: Secondary | ICD-10-CM | POA: Diagnosis not present

## 2023-07-11 DIAGNOSIS — G4733 Obstructive sleep apnea (adult) (pediatric): Secondary | ICD-10-CM | POA: Diagnosis not present

## 2023-07-15 DIAGNOSIS — R0789 Other chest pain: Secondary | ICD-10-CM | POA: Diagnosis not present

## 2023-07-15 DIAGNOSIS — I2510 Atherosclerotic heart disease of native coronary artery without angina pectoris: Secondary | ICD-10-CM | POA: Diagnosis not present

## 2023-07-15 DIAGNOSIS — R9431 Abnormal electrocardiogram [ECG] [EKG]: Secondary | ICD-10-CM | POA: Diagnosis not present

## 2023-07-24 DIAGNOSIS — R0902 Hypoxemia: Secondary | ICD-10-CM | POA: Diagnosis not present

## 2023-07-24 DIAGNOSIS — G4733 Obstructive sleep apnea (adult) (pediatric): Secondary | ICD-10-CM | POA: Diagnosis not present

## 2023-07-24 DIAGNOSIS — G4736 Sleep related hypoventilation in conditions classified elsewhere: Secondary | ICD-10-CM | POA: Diagnosis not present

## 2023-08-01 DIAGNOSIS — I10 Essential (primary) hypertension: Secondary | ICD-10-CM | POA: Diagnosis not present

## 2023-08-01 DIAGNOSIS — Z6836 Body mass index (BMI) 36.0-36.9, adult: Secondary | ICD-10-CM | POA: Diagnosis not present

## 2023-08-01 DIAGNOSIS — E119 Type 2 diabetes mellitus without complications: Secondary | ICD-10-CM | POA: Diagnosis not present

## 2023-08-01 DIAGNOSIS — J00 Acute nasopharyngitis [common cold]: Secondary | ICD-10-CM | POA: Diagnosis not present

## 2023-08-01 DIAGNOSIS — E559 Vitamin D deficiency, unspecified: Secondary | ICD-10-CM | POA: Diagnosis not present

## 2023-08-01 DIAGNOSIS — E7800 Pure hypercholesterolemia, unspecified: Secondary | ICD-10-CM | POA: Diagnosis not present

## 2023-08-07 DIAGNOSIS — E6609 Other obesity due to excess calories: Secondary | ICD-10-CM | POA: Diagnosis not present

## 2023-08-07 DIAGNOSIS — E039 Hypothyroidism, unspecified: Secondary | ICD-10-CM | POA: Diagnosis not present

## 2023-08-07 DIAGNOSIS — I10 Essential (primary) hypertension: Secondary | ICD-10-CM | POA: Diagnosis not present

## 2023-08-07 DIAGNOSIS — G4733 Obstructive sleep apnea (adult) (pediatric): Secondary | ICD-10-CM | POA: Diagnosis not present

## 2023-08-07 DIAGNOSIS — R030 Elevated blood-pressure reading, without diagnosis of hypertension: Secondary | ICD-10-CM | POA: Diagnosis not present

## 2023-08-07 DIAGNOSIS — E119 Type 2 diabetes mellitus without complications: Secondary | ICD-10-CM | POA: Diagnosis not present

## 2023-08-07 DIAGNOSIS — Z6836 Body mass index (BMI) 36.0-36.9, adult: Secondary | ICD-10-CM | POA: Diagnosis not present

## 2023-08-26 DIAGNOSIS — R0902 Hypoxemia: Secondary | ICD-10-CM | POA: Diagnosis not present

## 2023-08-26 DIAGNOSIS — G4733 Obstructive sleep apnea (adult) (pediatric): Secondary | ICD-10-CM | POA: Diagnosis not present

## 2023-08-26 DIAGNOSIS — G4761 Periodic limb movement disorder: Secondary | ICD-10-CM | POA: Diagnosis not present

## 2023-08-26 DIAGNOSIS — G4730 Sleep apnea, unspecified: Secondary | ICD-10-CM | POA: Diagnosis not present

## 2023-08-26 DIAGNOSIS — G4736 Sleep related hypoventilation in conditions classified elsewhere: Secondary | ICD-10-CM | POA: Diagnosis not present

## 2023-08-28 DIAGNOSIS — M129 Arthropathy, unspecified: Secondary | ICD-10-CM | POA: Diagnosis not present

## 2023-08-28 DIAGNOSIS — Z79899 Other long term (current) drug therapy: Secondary | ICD-10-CM | POA: Diagnosis not present

## 2023-08-28 DIAGNOSIS — E7800 Pure hypercholesterolemia, unspecified: Secondary | ICD-10-CM | POA: Diagnosis not present

## 2023-08-28 DIAGNOSIS — I10 Essential (primary) hypertension: Secondary | ICD-10-CM | POA: Diagnosis not present

## 2023-08-28 DIAGNOSIS — E559 Vitamin D deficiency, unspecified: Secondary | ICD-10-CM | POA: Diagnosis not present

## 2023-08-28 DIAGNOSIS — Z794 Long term (current) use of insulin: Secondary | ICD-10-CM | POA: Diagnosis not present

## 2023-08-28 DIAGNOSIS — R790 Abnormal level of blood mineral: Secondary | ICD-10-CM | POA: Diagnosis not present

## 2023-08-28 DIAGNOSIS — E119 Type 2 diabetes mellitus without complications: Secondary | ICD-10-CM | POA: Diagnosis not present

## 2023-08-28 DIAGNOSIS — R351 Nocturia: Secondary | ICD-10-CM | POA: Diagnosis not present

## 2023-09-03 DIAGNOSIS — Z6836 Body mass index (BMI) 36.0-36.9, adult: Secondary | ICD-10-CM | POA: Diagnosis not present

## 2023-09-03 DIAGNOSIS — E119 Type 2 diabetes mellitus without complications: Secondary | ICD-10-CM | POA: Diagnosis not present

## 2023-09-03 DIAGNOSIS — I10 Essential (primary) hypertension: Secondary | ICD-10-CM | POA: Diagnosis not present

## 2023-09-03 DIAGNOSIS — Z794 Long term (current) use of insulin: Secondary | ICD-10-CM | POA: Diagnosis not present

## 2023-09-03 DIAGNOSIS — E7800 Pure hypercholesterolemia, unspecified: Secondary | ICD-10-CM | POA: Diagnosis not present

## 2023-09-09 DIAGNOSIS — Z6836 Body mass index (BMI) 36.0-36.9, adult: Secondary | ICD-10-CM | POA: Diagnosis not present

## 2023-09-09 DIAGNOSIS — E119 Type 2 diabetes mellitus without complications: Secondary | ICD-10-CM | POA: Diagnosis not present

## 2023-09-09 DIAGNOSIS — G4733 Obstructive sleep apnea (adult) (pediatric): Secondary | ICD-10-CM | POA: Diagnosis not present

## 2023-09-09 DIAGNOSIS — Z794 Long term (current) use of insulin: Secondary | ICD-10-CM | POA: Diagnosis not present

## 2023-09-09 DIAGNOSIS — E6609 Other obesity due to excess calories: Secondary | ICD-10-CM | POA: Diagnosis not present

## 2023-09-09 DIAGNOSIS — I10 Essential (primary) hypertension: Secondary | ICD-10-CM | POA: Diagnosis not present

## 2023-10-01 DIAGNOSIS — E119 Type 2 diabetes mellitus without complications: Secondary | ICD-10-CM | POA: Diagnosis not present

## 2023-10-01 DIAGNOSIS — N3941 Urge incontinence: Secondary | ICD-10-CM | POA: Diagnosis not present

## 2023-10-01 DIAGNOSIS — E7800 Pure hypercholesterolemia, unspecified: Secondary | ICD-10-CM | POA: Diagnosis not present

## 2023-10-01 DIAGNOSIS — R350 Frequency of micturition: Secondary | ICD-10-CM | POA: Diagnosis not present

## 2023-10-01 DIAGNOSIS — I10 Essential (primary) hypertension: Secondary | ICD-10-CM | POA: Diagnosis not present

## 2023-10-01 DIAGNOSIS — Z6836 Body mass index (BMI) 36.0-36.9, adult: Secondary | ICD-10-CM | POA: Diagnosis not present

## 2023-10-01 DIAGNOSIS — Z794 Long term (current) use of insulin: Secondary | ICD-10-CM | POA: Diagnosis not present

## 2023-11-01 DIAGNOSIS — I10 Essential (primary) hypertension: Secondary | ICD-10-CM | POA: Diagnosis not present

## 2023-11-01 DIAGNOSIS — Z6836 Body mass index (BMI) 36.0-36.9, adult: Secondary | ICD-10-CM | POA: Diagnosis not present

## 2023-11-01 DIAGNOSIS — E119 Type 2 diabetes mellitus without complications: Secondary | ICD-10-CM | POA: Diagnosis not present

## 2023-11-01 DIAGNOSIS — Z794 Long term (current) use of insulin: Secondary | ICD-10-CM | POA: Diagnosis not present

## 2023-11-29 DIAGNOSIS — Z79899 Other long term (current) drug therapy: Secondary | ICD-10-CM | POA: Diagnosis not present

## 2023-11-29 DIAGNOSIS — Z794 Long term (current) use of insulin: Secondary | ICD-10-CM | POA: Diagnosis not present

## 2023-11-29 DIAGNOSIS — R790 Abnormal level of blood mineral: Secondary | ICD-10-CM | POA: Diagnosis not present

## 2023-11-29 DIAGNOSIS — E119 Type 2 diabetes mellitus without complications: Secondary | ICD-10-CM | POA: Diagnosis not present

## 2023-11-29 DIAGNOSIS — E559 Vitamin D deficiency, unspecified: Secondary | ICD-10-CM | POA: Diagnosis not present

## 2023-11-29 DIAGNOSIS — R110 Nausea: Secondary | ICD-10-CM | POA: Diagnosis not present

## 2023-11-29 DIAGNOSIS — Z6835 Body mass index (BMI) 35.0-35.9, adult: Secondary | ICD-10-CM | POA: Diagnosis not present

## 2023-11-29 DIAGNOSIS — J329 Chronic sinusitis, unspecified: Secondary | ICD-10-CM | POA: Diagnosis not present

## 2023-11-29 DIAGNOSIS — D539 Nutritional anemia, unspecified: Secondary | ICD-10-CM | POA: Diagnosis not present

## 2023-11-29 DIAGNOSIS — E7800 Pure hypercholesterolemia, unspecified: Secondary | ICD-10-CM | POA: Diagnosis not present

## 2023-12-03 DIAGNOSIS — E559 Vitamin D deficiency, unspecified: Secondary | ICD-10-CM | POA: Diagnosis not present

## 2023-12-03 DIAGNOSIS — I10 Essential (primary) hypertension: Secondary | ICD-10-CM | POA: Diagnosis not present

## 2023-12-03 DIAGNOSIS — Z794 Long term (current) use of insulin: Secondary | ICD-10-CM | POA: Diagnosis not present

## 2023-12-03 DIAGNOSIS — D539 Nutritional anemia, unspecified: Secondary | ICD-10-CM | POA: Diagnosis not present

## 2023-12-03 DIAGNOSIS — E7800 Pure hypercholesterolemia, unspecified: Secondary | ICD-10-CM | POA: Diagnosis not present

## 2023-12-03 DIAGNOSIS — E119 Type 2 diabetes mellitus without complications: Secondary | ICD-10-CM | POA: Diagnosis not present

## 2023-12-03 DIAGNOSIS — R790 Abnormal level of blood mineral: Secondary | ICD-10-CM | POA: Diagnosis not present

## 2023-12-05 DIAGNOSIS — E7800 Pure hypercholesterolemia, unspecified: Secondary | ICD-10-CM | POA: Diagnosis not present

## 2023-12-05 DIAGNOSIS — Z6835 Body mass index (BMI) 35.0-35.9, adult: Secondary | ICD-10-CM | POA: Diagnosis not present

## 2023-12-05 DIAGNOSIS — E119 Type 2 diabetes mellitus without complications: Secondary | ICD-10-CM | POA: Diagnosis not present

## 2023-12-05 DIAGNOSIS — Z794 Long term (current) use of insulin: Secondary | ICD-10-CM | POA: Diagnosis not present

## 2023-12-05 DIAGNOSIS — E559 Vitamin D deficiency, unspecified: Secondary | ICD-10-CM | POA: Diagnosis not present

## 2023-12-17 DIAGNOSIS — E559 Vitamin D deficiency, unspecified: Secondary | ICD-10-CM | POA: Diagnosis not present

## 2023-12-17 DIAGNOSIS — Z6835 Body mass index (BMI) 35.0-35.9, adult: Secondary | ICD-10-CM | POA: Diagnosis not present

## 2023-12-17 DIAGNOSIS — E611 Iron deficiency: Secondary | ICD-10-CM | POA: Diagnosis not present

## 2023-12-17 DIAGNOSIS — J329 Chronic sinusitis, unspecified: Secondary | ICD-10-CM | POA: Diagnosis not present

## 2024-02-12 DIAGNOSIS — R431 Parosmia: Secondary | ICD-10-CM | POA: Diagnosis not present

## 2024-02-12 DIAGNOSIS — K118 Other diseases of salivary glands: Secondary | ICD-10-CM | POA: Diagnosis not present

## 2024-02-12 DIAGNOSIS — J329 Chronic sinusitis, unspecified: Secondary | ICD-10-CM | POA: Diagnosis not present

## 2024-02-20 DIAGNOSIS — R1111 Vomiting without nausea: Secondary | ICD-10-CM | POA: Diagnosis not present

## 2024-02-20 DIAGNOSIS — R0789 Other chest pain: Secondary | ICD-10-CM | POA: Diagnosis not present

## 2024-02-20 DIAGNOSIS — R079 Chest pain, unspecified: Secondary | ICD-10-CM | POA: Diagnosis not present

## 2024-02-20 DIAGNOSIS — R531 Weakness: Secondary | ICD-10-CM | POA: Diagnosis not present

## 2024-02-20 DIAGNOSIS — R42 Dizziness and giddiness: Secondary | ICD-10-CM | POA: Diagnosis not present

## 2024-02-20 DIAGNOSIS — W19XXXA Unspecified fall, initial encounter: Secondary | ICD-10-CM | POA: Diagnosis not present

## 2024-02-21 DIAGNOSIS — I440 Atrioventricular block, first degree: Secondary | ICD-10-CM | POA: Diagnosis not present

## 2024-02-21 DIAGNOSIS — R079 Chest pain, unspecified: Secondary | ICD-10-CM | POA: Diagnosis not present

## 2024-03-12 DIAGNOSIS — Z794 Long term (current) use of insulin: Secondary | ICD-10-CM | POA: Diagnosis not present

## 2024-03-12 DIAGNOSIS — Z6835 Body mass index (BMI) 35.0-35.9, adult: Secondary | ICD-10-CM | POA: Diagnosis not present

## 2024-03-12 DIAGNOSIS — E559 Vitamin D deficiency, unspecified: Secondary | ICD-10-CM | POA: Diagnosis not present

## 2024-03-12 DIAGNOSIS — E7800 Pure hypercholesterolemia, unspecified: Secondary | ICD-10-CM | POA: Diagnosis not present

## 2024-03-12 DIAGNOSIS — M542 Cervicalgia: Secondary | ICD-10-CM | POA: Diagnosis not present

## 2024-03-12 DIAGNOSIS — E119 Type 2 diabetes mellitus without complications: Secondary | ICD-10-CM | POA: Diagnosis not present

## 2024-03-12 DIAGNOSIS — Z79899 Other long term (current) drug therapy: Secondary | ICD-10-CM | POA: Diagnosis not present

## 2024-03-12 DIAGNOSIS — Z760 Encounter for issue of repeat prescription: Secondary | ICD-10-CM | POA: Diagnosis not present

## 2024-03-12 DIAGNOSIS — D539 Nutritional anemia, unspecified: Secondary | ICD-10-CM | POA: Diagnosis not present

## 2024-03-16 DIAGNOSIS — D539 Nutritional anemia, unspecified: Secondary | ICD-10-CM | POA: Diagnosis not present

## 2024-03-16 DIAGNOSIS — Z79899 Other long term (current) drug therapy: Secondary | ICD-10-CM | POA: Diagnosis not present

## 2024-03-16 DIAGNOSIS — E119 Type 2 diabetes mellitus without complications: Secondary | ICD-10-CM | POA: Diagnosis not present

## 2024-03-16 DIAGNOSIS — Z794 Long term (current) use of insulin: Secondary | ICD-10-CM | POA: Diagnosis not present

## 2024-03-16 DIAGNOSIS — E7800 Pure hypercholesterolemia, unspecified: Secondary | ICD-10-CM | POA: Diagnosis not present

## 2024-03-16 DIAGNOSIS — E559 Vitamin D deficiency, unspecified: Secondary | ICD-10-CM | POA: Diagnosis not present

## 2024-04-03 ENCOUNTER — Emergency Department (HOSPITAL_BASED_OUTPATIENT_CLINIC_OR_DEPARTMENT_OTHER)

## 2024-04-03 ENCOUNTER — Other Ambulatory Visit: Payer: Self-pay

## 2024-04-03 ENCOUNTER — Emergency Department (HOSPITAL_BASED_OUTPATIENT_CLINIC_OR_DEPARTMENT_OTHER)
Admission: EM | Admit: 2024-04-03 | Discharge: 2024-04-03 | Disposition: A | Discharge status: Home / Self Care | Attending: Emergency Medicine | Admitting: Emergency Medicine

## 2024-04-03 DIAGNOSIS — M79675 Pain in left toe(s): Secondary | ICD-10-CM | POA: Insufficient documentation

## 2024-04-03 DIAGNOSIS — E119 Type 2 diabetes mellitus without complications: Secondary | ICD-10-CM | POA: Insufficient documentation

## 2024-04-03 DIAGNOSIS — Z79899 Other long term (current) drug therapy: Secondary | ICD-10-CM | POA: Diagnosis not present

## 2024-04-03 DIAGNOSIS — Z7984 Long term (current) use of oral hypoglycemic drugs: Secondary | ICD-10-CM | POA: Diagnosis not present

## 2024-04-03 DIAGNOSIS — M7989 Other specified soft tissue disorders: Secondary | ICD-10-CM | POA: Diagnosis not present

## 2024-04-03 DIAGNOSIS — I10 Essential (primary) hypertension: Secondary | ICD-10-CM | POA: Insufficient documentation

## 2024-04-03 NOTE — Discharge Instructions (Signed)
 You are here today for stubbed toe.  Your x-ray today did not show any fracture and there was no wounds noted on your physical exam.  I have low suspicion for any emergent cause of your symptoms today.  Recommend you continue to use Tylenol  and ibuprofen  as pain relief.  You can also use capsaicin cream over-the-counter for further pain relief over the toe.  Otherwise follow-up with PCP for any persistent symptoms.

## 2024-04-03 NOTE — ED Triage Notes (Signed)
 Pt POV ambulatory to room after stubbing L pinky toe on chair, now swollen and painful.

## 2024-04-03 NOTE — ED Provider Notes (Signed)
 [place] EMERGENCY DEPARTMENT AT [facility] Provider Note   CSN: [id number] Arrival date & time: 04/03/24  2100     Patient presents with: Toe Injury   Donald Cole is a 63 y.o. male.  HPI This patient is a 63 year old male who presents to the ED for concern of left fifth toe pain after stubbing it on a cabinet leg today.  Since then has taken Aleve  with relief but was concerned he may have fractured his toe.  History of diabetes.  Denies any numbness, wheeze, tingling, difficulty with ambulation.    Prior to Admission medications   Medication Sig Start Date End Date Taking? Authorizing Provider  rosuvastatin  (CRESTOR ) 10 MG tablet TAKE ONE TABLET AT NIGHT FOR CHOLESTEROL 12/25/21   Kennyth Worth HERO, MD  amLODipine  (NORVASC ) 10 MG tablet Take 1 tablet (10 mg total) by mouth daily. 04/05/22   Harris, Abigail, PA-C  amoxicillin -clavulanate (AUGMENTIN ) 875-125 MG tablet Take 1 tablet by mouth every 12 (twelve) hours. 04/05/22   Harris, Abigail, PA-C  diclofenac  Sodium (VOLTAREN ) 1 % GEL Apply 4 g topically 4 (four) times daily as needed. 03/26/23   Griselda Norris, MD  Menthol-Camphor (TIGER BALM ARTHRITIS RUB EX) Apply 1 application topically daily as needed (pain).    [provider]  metFORMIN  (GLUCOPHAGE -XR) 500 MG 24 hr tablet Take 500 mg tablet daily week 1, take 500 mg tablet twice daily week 2, take two 500 mg tablet (1000 g) morning and one 500 mg tablet at night week 3, take 1000 mg (2 tablets) twice daily thereafter. 01/04/22   Raspet, Erin K, PA-C  Multiple Vitamins-Minerals (ONE-A-DAY ENERGY) TABS Take 1 tablet by mouth daily.    [provider]  nystatin  (MYCOSTATIN ) 100000 UNIT/ML suspension Take 5 mLs (500,000 Units total) by mouth 4 (four) times daily. 10/19/22   Yolande Lamar BROCKS, MD  OVER THE COUNTER MEDICATION Take 1 Scoop by mouth daily. Super beets    [provider]  tamsulosin  (FLOMAX ) 0.4 MG CAPS capsule Take 1 capsule (0.4 mg  total) by mouth daily. 05/08/22   Lilland, Alana, DO  valsartan -hydrochlorothiazide  (DIOVAN -HCT) 160-25 MG tablet Take 1 tablet by mouth daily. 04/05/22   Harris, Abigail, PA-C    Allergies: Tramadol     Review of Systems  Musculoskeletal:  Positive for arthralgias.  All other systems reviewed and are negative.   Updated Vital Signs BP (!) 165/84   Pulse 70   Temp 98.3 F (36.8 C) (Oral)   Resp 19   Ht 5' 8 (1.727 m)   Wt 104.3 kg   SpO2 94%   BMI 34.97 kg/m   Physical Exam Vitals and nursing note reviewed.  Constitutional:      General: He is not in acute distress.    Appearance: Normal appearance. He is not ill-appearing or diaphoretic.  HENT:     Head: Normocephalic and atraumatic.  Eyes:     Extraocular Movements: Extraocular movements intact.     Conjunctiva/sclera: Conjunctivae normal.  Cardiovascular:     Rate and Rhythm: Normal rate and regular rhythm.     Pulses: Normal pulses.     Heart sounds: Normal heart sounds. No murmur heard.    No friction rub. No gallop.  Pulmonary:     Effort: Pulmonary effort is normal. No respiratory distress.     Breath sounds: Normal breath sounds.  Abdominal:     General: Abdomen is flat.     Palpations: Abdomen is soft.     Tenderness: There  is no abdominal tenderness.  Musculoskeletal:        General: Tenderness (Mild tenderness over the DIP joint of the fifth toe of left foot) present. No swelling, deformity or signs of injury. Normal range of motion.     Right lower leg: No edema.     Left lower leg: No edema.  Skin:    General: Skin is warm and dry.     Capillary Refill: Capillary refill takes less than 2 seconds.     Findings: No bruising, erythema or lesion.  Neurological:     General: No focal deficit present.     Mental Status: He is alert and oriented to person, place, and time. Mental status is at baseline.     Sensory: No sensory deficit.     Motor: No weakness.     Coordination: Coordination normal.      Gait: Gait normal.  Psychiatric:        Mood and Affect: Mood normal.     (all labs ordered are listed, but only abnormal results are displayed) Labs Reviewed - No data to display  EKG: None  Radiology: DG Toe 5th Left Result Date: 04/03/2024 CLINICAL DATA:  Left 5th toe injury, pain, swelling EXAM: DG TOE 5TH LEFT COMPARISON:  None Available. FINDINGS: No acute bony abnormality. Specifically, no fracture, subluxation, or dislocation. Soft tissues are intact. IMPRESSION: No acute bony abnormality. Electronically Signed   By: Franky Crease M.D.   On: 04/03/2024 21:33    Procedures   Medications Ordered in the ED - No data to display                                Medical Decision Making Amount and/or Complexity of Data Reviewed Radiology: ordered.   This patient is a 63 year old male who presents to the ED for concern of left fifth toe pain after stubbing it on a cabinet leg today.  Since then has taken Aleve  with relief but was concerned he may have fractured his toe.  History of diabetes.  On physical exam, patient is in no acute distress, afebrile, alert and orient x 4, speaking in full sentences, nontachypneic, nontachycardic.  Has good ROM in all toes and good cap refill.  Mildly tender over the DIP joint of the fifth toe.  Otherwise unremarkable with no lacerations or other injuries noted.  With x-ray negative, and no injury noted to the toe otherwise, low suspicion for any emergent causes of symptoms today.  Will have him continue to follow-up with PCP for any persistent symptoms.  Will have continue to take over-the-counter pain medications as needed for further pain relief.  Patient vital signs have remained stable throughout the course of patient's time in the ED. Low suspicion for any other emergent pathology at this time. I believe this patient is safe to be discharged. Provided strict return to ER precautions. Patient expressed agreement and understanding of plan. All  questions were answered.  Differential diagnoses prior to evaluation: The emergent differential diagnosis includes, but is not limited to, fracture, ligamentous injury, neurovascular injury, dislocation, malalignment. This is not an exhaustive differential.   Past Medical History / Co-morbidities / Social History: Diabetes mellitus, HTN, GERD, arthritis  Additional history: Chart reviewed. Pertinent results include: Last seen in the ED on 02/20/2024 for chest discomfort, finding no emergent conditions at time.  Lab Tests/Imaging studies: I personally interpreted labs/imaging and the pertinent results include:    .  X-ray of left fifth toe unremarkable I agree with the radiologist interpretation.     Medications:  I have reviewed the patients home medicines and have made adjustments as needed.  Critical Interventions: None  Social Determinants of Health: None  Disposition: After consideration of the diagnostic results and the patients response to treatment, I feel that the patient would benefit from discharge treatment as abov.   emergency department workup does not suggest an emergent condition requiring admission or immediate intervention beyond what has been performed at this time. The plan is: Follow-up PCP as needed, return to the ED for any new or worsening symptoms, symptomatic management at home. The patient is safe for discharge and has been instructed to return immediately for worsening symptoms, change in symptoms or any other concerns.   Final diagnoses:  Pain of toe of left foot    ED Discharge Orders     None          Beola Terrall GORMAN DEVONNA 04/03/24 2345    Darra Fonda MATSU, MD 04/13/24 [phone]

## 2024-06-04 DIAGNOSIS — E119 Type 2 diabetes mellitus without complications: Secondary | ICD-10-CM | POA: Diagnosis not present

## 2024-06-04 DIAGNOSIS — I10 Essential (primary) hypertension: Secondary | ICD-10-CM | POA: Diagnosis not present

## 2024-06-04 DIAGNOSIS — Z6836 Body mass index (BMI) 36.0-36.9, adult: Secondary | ICD-10-CM | POA: Diagnosis not present

## 2024-06-04 DIAGNOSIS — R0989 Other specified symptoms and signs involving the circulatory and respiratory systems: Secondary | ICD-10-CM | POA: Diagnosis not present

## 2024-06-04 DIAGNOSIS — Z5320 Procedure and treatment not carried out because of patient's decision for unspecified reasons: Secondary | ICD-10-CM | POA: Diagnosis not present

## 2024-06-04 DIAGNOSIS — R9431 Abnormal electrocardiogram [ECG] [EKG]: Secondary | ICD-10-CM | POA: Diagnosis not present

## 2024-06-04 DIAGNOSIS — Z794 Long term (current) use of insulin: Secondary | ICD-10-CM | POA: Diagnosis not present

## 2024-06-15 DIAGNOSIS — R9431 Abnormal electrocardiogram [ECG] [EKG]: Secondary | ICD-10-CM | POA: Diagnosis not present

## 2024-06-17 DIAGNOSIS — R9431 Abnormal electrocardiogram [ECG] [EKG]: Secondary | ICD-10-CM | POA: Diagnosis not present

## 2024-06-30 DIAGNOSIS — R0989 Other specified symptoms and signs involving the circulatory and respiratory systems: Secondary | ICD-10-CM | POA: Diagnosis not present

## 2024-07-06 DIAGNOSIS — R0989 Other specified symptoms and signs involving the circulatory and respiratory systems: Secondary | ICD-10-CM | POA: Diagnosis not present

## 2024-07-06 DIAGNOSIS — R9431 Abnormal electrocardiogram [ECG] [EKG]: Secondary | ICD-10-CM | POA: Diagnosis not present

## 2024-07-06 DIAGNOSIS — I2510 Atherosclerotic heart disease of native coronary artery without angina pectoris: Secondary | ICD-10-CM | POA: Diagnosis not present

## 2024-07-06 DIAGNOSIS — R0789 Other chest pain: Secondary | ICD-10-CM | POA: Diagnosis not present

## 2024-07-21 DIAGNOSIS — S92502A Displaced unspecified fracture of left lesser toe(s), initial encounter for closed fracture: Secondary | ICD-10-CM | POA: Diagnosis not present

## 2024-07-21 DIAGNOSIS — S99922A Unspecified injury of left foot, initial encounter: Secondary | ICD-10-CM | POA: Diagnosis not present

## 2024-07-21 DIAGNOSIS — S92352A Displaced fracture of fifth metatarsal bone, left foot, initial encounter for closed fracture: Secondary | ICD-10-CM | POA: Diagnosis not present

## 2024-07-22 DIAGNOSIS — M25561 Pain in right knee: Secondary | ICD-10-CM | POA: Diagnosis not present

## 2024-07-22 DIAGNOSIS — R262 Difficulty in walking, not elsewhere classified: Secondary | ICD-10-CM | POA: Diagnosis not present

## 2024-07-22 DIAGNOSIS — M1711 Unilateral primary osteoarthritis, right knee: Secondary | ICD-10-CM | POA: Diagnosis not present

## 2024-07-22 DIAGNOSIS — M25661 Stiffness of right knee, not elsewhere classified: Secondary | ICD-10-CM | POA: Diagnosis not present
